# Patient Record
Sex: Female | Born: 1985 | Race: White | Hispanic: No | Marital: Single | Smoking: Current every day smoker
Health system: Southern US, Community
[De-identification: ages and names within clinical notes are randomized; demographics above are authoritative.]

## PROBLEM LIST (undated history)

## (undated) DIAGNOSIS — Z8739 Personal history of other diseases of the musculoskeletal system and connective tissue: Secondary | ICD-10-CM

## (undated) DIAGNOSIS — N83209 Unspecified ovarian cyst, unspecified side: Secondary | ICD-10-CM

## (undated) DIAGNOSIS — F909 Attention-deficit hyperactivity disorder, unspecified type: Secondary | ICD-10-CM

## (undated) DIAGNOSIS — F419 Anxiety disorder, unspecified: Secondary | ICD-10-CM

## (undated) DIAGNOSIS — E119 Type 2 diabetes mellitus without complications: Secondary | ICD-10-CM

## (undated) DIAGNOSIS — I10 Essential (primary) hypertension: Secondary | ICD-10-CM

## (undated) DIAGNOSIS — F410 Panic disorder [episodic paroxysmal anxiety] without agoraphobia: Secondary | ICD-10-CM

## (undated) DIAGNOSIS — N200 Calculus of kidney: Secondary | ICD-10-CM

## (undated) DIAGNOSIS — K259 Gastric ulcer, unspecified as acute or chronic, without hemorrhage or perforation: Secondary | ICD-10-CM

## (undated) HISTORY — PX: TONSILLECTOMY: SUR1361

## (undated) HISTORY — PX: TUBAL LIGATION: SHX77

---

## 2009-03-29 DIAGNOSIS — I10 Essential (primary) hypertension: Secondary | ICD-10-CM | POA: Insufficient documentation

## 2011-09-26 DIAGNOSIS — K219 Gastro-esophageal reflux disease without esophagitis: Secondary | ICD-10-CM | POA: Insufficient documentation

## 2011-10-02 DIAGNOSIS — Z765 Malingerer [conscious simulation]: Secondary | ICD-10-CM | POA: Insufficient documentation

## 2011-10-02 DIAGNOSIS — F1910 Other psychoactive substance abuse, uncomplicated: Secondary | ICD-10-CM | POA: Insufficient documentation

## 2014-01-20 DIAGNOSIS — G8929 Other chronic pain: Secondary | ICD-10-CM | POA: Insufficient documentation

## 2014-07-11 ENCOUNTER — Emergency Department: Payer: Self-pay | Admitting: Emergency Medicine

## 2014-08-05 DIAGNOSIS — M2240 Chondromalacia patellae, unspecified knee: Secondary | ICD-10-CM | POA: Insufficient documentation

## 2014-09-02 DIAGNOSIS — E1165 Type 2 diabetes mellitus with hyperglycemia: Secondary | ICD-10-CM | POA: Insufficient documentation

## 2014-09-02 DIAGNOSIS — IMO0002 Reserved for concepts with insufficient information to code with codable children: Secondary | ICD-10-CM | POA: Insufficient documentation

## 2014-09-02 DIAGNOSIS — F988 Other specified behavioral and emotional disorders with onset usually occurring in childhood and adolescence: Secondary | ICD-10-CM | POA: Insufficient documentation

## 2014-09-02 DIAGNOSIS — F319 Bipolar disorder, unspecified: Secondary | ICD-10-CM | POA: Insufficient documentation

## 2014-09-16 ENCOUNTER — Encounter (HOSPITAL_BASED_OUTPATIENT_CLINIC_OR_DEPARTMENT_OTHER): Payer: Self-pay | Admitting: *Deleted

## 2014-09-16 ENCOUNTER — Emergency Department (HOSPITAL_BASED_OUTPATIENT_CLINIC_OR_DEPARTMENT_OTHER)
Admission: EM | Admit: 2014-09-16 | Discharge: 2014-09-16 | Disposition: A | Discharge status: Home / Self Care | Payer: Medicaid Other | Attending: Emergency Medicine | Admitting: Emergency Medicine

## 2014-09-16 ENCOUNTER — Emergency Department (HOSPITAL_BASED_OUTPATIENT_CLINIC_OR_DEPARTMENT_OTHER): Payer: Medicaid Other

## 2014-09-16 DIAGNOSIS — Z79899 Other long term (current) drug therapy: Secondary | ICD-10-CM | POA: Diagnosis not present

## 2014-09-16 DIAGNOSIS — E119 Type 2 diabetes mellitus without complications: Secondary | ICD-10-CM | POA: Diagnosis not present

## 2014-09-16 DIAGNOSIS — I10 Essential (primary) hypertension: Secondary | ICD-10-CM | POA: Diagnosis not present

## 2014-09-16 DIAGNOSIS — Z8719 Personal history of other diseases of the digestive system: Secondary | ICD-10-CM | POA: Insufficient documentation

## 2014-09-16 DIAGNOSIS — Z880 Allergy status to penicillin: Secondary | ICD-10-CM | POA: Diagnosis not present

## 2014-09-16 DIAGNOSIS — Z720 Tobacco use: Secondary | ICD-10-CM | POA: Insufficient documentation

## 2014-09-16 DIAGNOSIS — Z87442 Personal history of urinary calculi: Secondary | ICD-10-CM | POA: Diagnosis not present

## 2014-09-16 DIAGNOSIS — F909 Attention-deficit hyperactivity disorder, unspecified type: Secondary | ICD-10-CM | POA: Diagnosis not present

## 2014-09-16 DIAGNOSIS — N390 Urinary tract infection, site not specified: Secondary | ICD-10-CM | POA: Insufficient documentation

## 2014-09-16 DIAGNOSIS — Z3202 Encounter for pregnancy test, result negative: Secondary | ICD-10-CM | POA: Diagnosis not present

## 2014-09-16 DIAGNOSIS — F410 Panic disorder [episodic paroxysmal anxiety] without agoraphobia: Secondary | ICD-10-CM | POA: Insufficient documentation

## 2014-09-16 DIAGNOSIS — R109 Unspecified abdominal pain: Secondary | ICD-10-CM | POA: Diagnosis present

## 2014-09-16 DIAGNOSIS — Z7982 Long term (current) use of aspirin: Secondary | ICD-10-CM | POA: Insufficient documentation

## 2014-09-16 DIAGNOSIS — R1011 Right upper quadrant pain: Secondary | ICD-10-CM

## 2014-09-16 HISTORY — DX: Calculus of kidney: N20.0

## 2014-09-16 HISTORY — DX: Anxiety disorder, unspecified: F41.9

## 2014-09-16 HISTORY — DX: Type 2 diabetes mellitus without complications: E11.9

## 2014-09-16 HISTORY — DX: Unspecified ovarian cyst, unspecified side: N83.209

## 2014-09-16 HISTORY — DX: Attention-deficit hyperactivity disorder, unspecified type: F90.9

## 2014-09-16 HISTORY — DX: Gastric ulcer, unspecified as acute or chronic, without hemorrhage or perforation: K25.9

## 2014-09-16 HISTORY — DX: Panic disorder (episodic paroxysmal anxiety): F41.0

## 2014-09-16 HISTORY — DX: Essential (primary) hypertension: I10

## 2014-09-16 LAB — CBC WITH DIFFERENTIAL/PLATELET
Basophils Absolute: 0 10*3/uL (ref 0.0–0.1)
Basophils Relative: 0 % (ref 0–1)
EOS ABS: 0.1 10*3/uL (ref 0.0–0.7)
Eosinophils Relative: 1 % (ref 0–5)
HCT: 38.7 % (ref 36.0–46.0)
HEMOGLOBIN: 13.5 g/dL (ref 12.0–15.0)
Lymphocytes Relative: 31 % (ref 12–46)
Lymphs Abs: 3.1 10*3/uL (ref 0.7–4.0)
MCH: 30.3 pg (ref 26.0–34.0)
MCHC: 34.9 g/dL (ref 30.0–36.0)
MCV: 86.8 fL (ref 78.0–100.0)
MONO ABS: 0.6 10*3/uL (ref 0.1–1.0)
Monocytes Relative: 6 % (ref 3–12)
NEUTROS PCT: 62 % (ref 43–77)
Neutro Abs: 6 10*3/uL (ref 1.7–7.7)
Platelets: 228 10*3/uL (ref 150–400)
RBC: 4.46 MIL/uL (ref 3.87–5.11)
RDW: 12.6 % (ref 11.5–15.5)
WBC: 9.8 10*3/uL (ref 4.0–10.5)

## 2014-09-16 LAB — COMPREHENSIVE METABOLIC PANEL
ALBUMIN: 3.8 g/dL (ref 3.5–5.2)
ALK PHOS: 89 U/L (ref 39–117)
ALT: 18 U/L (ref 0–35)
ANION GAP: 9 (ref 5–15)
AST: 14 U/L (ref 0–37)
BUN: 8 mg/dL (ref 6–23)
CHLORIDE: 104 mmol/L (ref 96–112)
CO2: 24 mmol/L (ref 19–32)
CREATININE: 0.61 mg/dL (ref 0.50–1.10)
Calcium: 8.8 mg/dL (ref 8.4–10.5)
GFR calc non Af Amer: >90 mL/min (ref >90)
Glucose, Bld: 105 mg/dL — ABNORMAL HIGH (ref 70–99)
Potassium: 3.5 mmol/L (ref 3.5–5.1)
Sodium: 137 mmol/L (ref 135–145)
TOTAL PROTEIN: 7 g/dL (ref 6.0–8.3)
Total Bilirubin: 0.6 mg/dL (ref 0.3–1.2)

## 2014-09-16 LAB — URINALYSIS, ROUTINE W REFLEX MICROSCOPIC
BILIRUBIN URINE: NEGATIVE
Glucose, UA: NEGATIVE mg/dL
KETONES UR: 15 mg/dL — AB
Nitrite: POSITIVE — AB
PROTEIN: 100 mg/dL — AB
Specific Gravity, Urine: 1.019 (ref 1.005–1.030)
Urobilinogen, UA: 1 mg/dL (ref 0.0–1.0)
pH: 5.5 (ref 5.0–8.0)

## 2014-09-16 LAB — URINE MICROSCOPIC-ADD ON

## 2014-09-16 LAB — PREGNANCY, URINE: Preg Test, Ur: NEGATIVE

## 2014-09-16 LAB — LIPASE, BLOOD: LIPASE: 22 U/L (ref 11–59)

## 2014-09-16 MED ORDER — MORPHINE SULFATE 4 MG/ML IJ SOLN
4.0000 mg | Freq: Once | INTRAMUSCULAR | Status: AC
  Administered 2014-09-16: 4 mg via INTRAVENOUS
  Filled 2014-09-16: qty 1

## 2014-09-16 MED ORDER — MORPHINE SULFATE 4 MG/ML IJ SOLN
4.0000 mg | Freq: Once | INTRAMUSCULAR | Status: AC
Start: 2014-09-16 — End: 2014-09-16
  Administered 2014-09-16: 4 mg via INTRAVENOUS
  Filled 2014-09-16: qty 1

## 2014-09-16 MED ORDER — HYDROCODONE-ACETAMINOPHEN 5-325 MG PO TABS: 1.0000 | ORAL_TABLET | Freq: Four times a day (QID) | ORAL | Status: DC | PRN

## 2014-09-16 MED ORDER — CEPHALEXIN 500 MG PO CAPS: 500.0000 mg | ORAL_CAPSULE | Freq: Two times a day (BID) | ORAL | Status: DC

## 2014-09-16 MED ORDER — PROMETHAZINE HCL 25 MG/ML IJ SOLN
12.5000 mg | Freq: Once | INTRAMUSCULAR | Status: AC
  Administered 2014-09-16: 12.5 mg via INTRAVENOUS
  Filled 2014-09-16: qty 1

## 2014-09-16 MED ORDER — CEPHALEXIN 500 MG PO CAPS: 500.0000 mg | ORAL_CAPSULE | Freq: Four times a day (QID) | ORAL | Status: DC

## 2014-09-16 NOTE — Discharge Instructions (Signed)
Take medications as prescribed. Follow up with PCP. Abdominal Pain Many things can cause abdominal pain. Usually, abdominal pain is not caused by a disease and will improve without treatment. It can often be observed and treated at home. Your health care provider will do a physical exam and possibly order blood tests and X-rays to help determine the seriousness of your pain. However, in many cases, more time must pass before a clear cause of the pain can be found. Before that point, your health care provider may not know if you need more testing or further treatment. HOME CARE INSTRUCTIONS  Monitor your abdominal pain for any changes. The following actions may help to alleviate any discomfort you are experiencing:  Only take over-the-counter or prescription medicines as directed by your health care provider.  Do not take laxatives unless directed to do so by your health care provider.  Try a clear liquid diet (broth, tea, or water) as directed by your health care provider. Slowly move to a bland diet as tolerated. SEEK MEDICAL CARE IF:  You have unexplained abdominal pain.  You have abdominal pain associated with nausea or diarrhea.  You have pain when you urinate or have a bowel movement.  You experience abdominal pain that wakes you in the night.  You have abdominal pain that is worsened or improved by eating food.  You have abdominal pain that is worsened with eating fatty foods.  You have a fever. SEEK IMMEDIATE MEDICAL CARE IF:   Your pain does not go away within 2 hours.  You keep throwing up (vomiting).  Your pain is felt only in portions of the abdomen, such as the right side or the left lower portion of the abdomen.  You pass bloody or black tarry stools. MAKE SURE YOU:  Understand these instructions.   Will watch your condition.   Will get help right away if you are not doing well or get worse.  Document Released: 02/20/2005 Document Revised: 05/18/2013  Document Reviewed: 01/20/2013 [facility] Patient Information 2015 [place], [place]. This information is not intended to replace advice given to you by your health care provider. Make sure you discuss any questions you have with your health care provider.  Urinary Tract Infection Urinary tract infections (UTIs) can develop anywhere along your urinary tract. Your urinary tract is your body's drainage system for removing wastes and extra water. Your urinary tract includes two kidneys, two ureters, a bladder, and a urethra. Your kidneys are a pair of bean-shaped organs. Each kidney is about the size of your fist. They are located below your ribs, one on each side of your spine. CAUSES Infections are caused by microbes, which are microscopic organisms, including fungi, viruses, and bacteria. These organisms are so small that they can only be seen through a microscope. Bacteria are the microbes that most commonly cause UTIs. SYMPTOMS  Symptoms of UTIs may vary by age and gender of the patient and by the location of the infection. Symptoms in young women typically include a frequent and intense urge to urinate and a painful, burning feeling in the bladder or urethra during urination. Older women and men are more likely to be tired, shaky, and weak and have muscle aches and abdominal pain. A fever may mean the infection is in your kidneys. Other symptoms of a kidney infection include pain in your back or sides below the ribs, nausea, and vomiting. DIAGNOSIS To diagnose a UTI, your caregiver will ask you about your symptoms. Your caregiver also will  ask to provide a urine sample. The urine sample will be tested for bacteria and white blood cells. White blood cells are made by your body to help fight infection. TREATMENT  Typically, UTIs can be treated with medication. Because most UTIs are caused by a bacterial infection, they usually can be treated with the use of antibiotics. The choice of antibiotic and length of  treatment depend on your symptoms and the type of bacteria causing your infection. HOME CARE INSTRUCTIONS  If you were prescribed antibiotics, take them exactly as your caregiver instructs you. Finish the medication even if you feel better after you have only taken some of the medication.  Drink enough water and fluids to keep your urine clear or pale yellow.  Avoid caffeine, tea, and carbonated beverages. They tend to irritate your bladder.  Empty your bladder often. Avoid holding urine for long periods of time.  Empty your bladder before and after sexual intercourse.  After a bowel movement, women should cleanse from front to back. Use each tissue only once. SEEK MEDICAL CARE IF:   You have back pain.  You develop a fever.  Your symptoms do not begin to resolve within 3 days. SEEK IMMEDIATE MEDICAL CARE IF:   You have severe back pain or lower abdominal pain.  You develop chills.  You have nausea or vomiting.  You have continued burning or discomfort with urination. MAKE SURE YOU:   Understand these instructions.  Will watch your condition.  Will get help right away if you are not doing well or get worse. Document Released: 02/20/2005 Document Revised: 11/12/2011 Document Reviewed: 06/21/2011 [facility] Patient Information 2015 [place]. This information is not intended to replace advice given to you by your health care provider. Make sure you discuss any questions you have with your health care provider.

## 2014-09-16 NOTE — ED Provider Notes (Signed)
CSN: [id number]     Arrival date & time 09/16/14  1143 History   First MD Initiated Contact with Patient 09/16/14 1238     Chief Complaint  Patient presents with  . Abdominal Pain     (Consider location/radiation/quality/duration/timing/severity/associated sxs/prior Treatment) HPI Comments: 29 yo female with a history of DM, HTN, gastric ulcer, kidney stones and GERD presenting with abdominal pain associated with nausea that started yesterday when she woke up.  Pain is described as severe 10/10 and unrelieved after taking Tums and Tylenol.  Pain is worse with movement, palpation and deep breaths.  Not associated with food.  Denies previous pain like this. Pain radiates to right flank, however states this does not feel like previous kidney stone.  Denies dysuria, frequency, vaginal discharge, fever, CP or SOB.   Patient is a 29 y.o. female presenting with abdominal pain.  Abdominal Pain   Past Medical History  Diagnosis Date  . Hypertension   . Diabetes mellitus without complication   . Anxiety   . Panic attack   . ADHD (attention deficit hyperactivity disorder)   . Stomach ulcer   . Ovarian cyst   . Kidney stone    Past Surgical History  Procedure Laterality Date  . Cesarean section    . Tonsillectomy    . Tubal ligation     No family history on file. History  Substance Use Topics  . Smoking status: Current Every Day Smoker -- 0.50 packs/day    Types: Cigarettes  . Smokeless tobacco: Not on file  . Alcohol Use: No   OB History    No data available     Review of Systems  Gastrointestinal: Positive for abdominal pain.  All other systems reviewed and are negative.     Allergies  Nsaids; Penicillins; and Zofran  Home Medications   Prior to Admission medications   Medication Sig Start Date End Date Taking? Authorizing Provider  ALPRAZolam (XANAX PO) Take by mouth.   Yes Historical Provider, MD  AmLODIPine Besylate (NORVASC PO) Take by mouth.   Yes Historical  Provider, MD  Aspirin (ASPIR-81 PO) Take by mouth.   Yes Historical Provider, MD  buprenorphine (SUBUTEX) 2 MG SUBL SL tablet Place 4 mg under the tongue daily.   Yes Historical Provider, MD  Lisdexamfetamine Dimesylate (VYVANSE PO) Take by mouth.   Yes Historical Provider, MD  LISINOPRIL PO Take by mouth.   Yes Historical Provider, MD  METFORMIN HCL PO Take by mouth.   Yes Historical Provider, MD   BP 111/79 mmHg  Pulse 92  Temp(Src) 98 F (36.7 C) (Oral)  Resp 20  Ht 5\' 5"  (1.651 m)  Wt 212 lb (96.163 kg)  BMI 35.28 kg/m2  SpO2 98%  LMP 09/02/2014 Physical Exam  Constitutional: She appears well-developed and well-nourished. No distress.  HENT:  Head: Normocephalic and atraumatic.  Eyes: EOM are normal.  Neck: Normal range of motion.  Cardiovascular: Normal rate, regular rhythm and normal heart sounds.   No murmur heard. Pulmonary/Chest: Effort normal and breath sounds normal. She has no wheezes. She has no rales.  Abdominal: Soft. She exhibits no shifting dullness, no distension and no mass. There is no hepatosplenomegaly. There is tenderness in the right upper quadrant. There is guarding. There is no rebound.  Lymphadenopathy:    She has no cervical adenopathy.  Nursing note and vitals reviewed.   ED Course  Procedures (including critical care time) Labs Review Labs Reviewed  URINALYSIS, ROUTINE W REFLEX MICROSCOPIC - Abnormal;  Notable for the following:    Color, Urine AMBER (*)    APPearance TURBID (*)    Hgb urine dipstick LARGE (*)    Ketones, ur 15 (*)    Protein, ur 100 (*)    Nitrite POSITIVE (*)    Leukocytes, UA LARGE (*)    All other components within normal limits  URINE MICROSCOPIC-ADD ON - Abnormal; Notable for the following:    Bacteria, UA MANY (*)    All other components within normal limits  COMPREHENSIVE METABOLIC PANEL - Abnormal; Notable for the following:    Glucose, Bld 105 (*)    All other components within normal limits  URINE CULTURE   PREGNANCY, URINE  CBC WITH DIFFERENTIAL/PLATELET  LIPASE, BLOOD    Imaging Review US Abdomen Complete  09/16/2014   CLINICAL DATA:  Right upper quadrant pain for 1 day  EXAM: ULTRASOUND ABDOMEN COMPLETE  COMPARISON:  None.  FINDINGS: Gallbladder: No gallstones or wall thickening visualized. No sonographic Murphy sign noted.  Common bile duct: Diameter: 3 mm  Liver: Mildly heterogeneous. No focal mass lesion is seen no biliary ductal dilatation is noted. This is of uncertain significance correlation with liver function tests is recommended.  IVC: Not well seen due to overlying bowel gas.  Pancreas: Not well visualized due to overlying bowel gas.  Spleen: Size and appearance within normal limits.  Right Kidney: Length: 10.5 cm. Echogenicity within normal limits. No mass or hydronephrosis visualized.  Left Kidney: Length: 11.6 cm. Minimal fullness of the collecting system is noted but may be related to a distended bladder.  Abdominal aorta: No aneurysm visualized.  Other findings: None.  IMPRESSION: Heterogeneity of the liver without focal mass lesion. This may be related to some underlying hepatic inflammatory change. Correlation with liver function tests is recommended.  Mild fullness of the left renal collecting system which may be related to a distended bladder.   Electronically Signed   By: Alcide Clever M.D.   On: 09/16/2014 14:54     EKG Interpretation None      MDM   Final diagnoses:  RUQ abdominal pain  UTI (lower urinary tract infection)   1:37 PM Labs unremarkable.  GB ultrasound negative.  UA positive for significant leuks and nitrites; will treat with Kelfex and order Urine culture. Pt reports she has allergy to PCN (hives), however has taken Keflex in the past without problems. Initial improvement with IV Morphine then recurrent pain- will give one additional dose of Morphine IV prior to dc. Given hydrocodone for pain. Given return precautions.     [name], NP 09/16/14  1533  Vanetta Mulders, MD 09/19/14 [phone]

## 2014-09-16 NOTE — ED Notes (Signed)
MD at bedside. 

## 2014-09-16 NOTE — ED Notes (Signed)
Pt states she had "a severe reflux attack" yesterday, when this pain started along with epigastric burning, took tums and burning resolved, but this pain continues.

## 2014-09-16 NOTE — ED Notes (Signed)
Right mid abdominal quad pain since yesterday.

## 2014-09-19 ENCOUNTER — Telehealth (HOSPITAL_BASED_OUTPATIENT_CLINIC_OR_DEPARTMENT_OTHER): Payer: Self-pay | Admitting: Emergency Medicine

## 2014-09-19 LAB — URINE CULTURE: SPECIAL REQUESTS: NORMAL

## 2014-09-20 ENCOUNTER — Telehealth (HOSPITAL_COMMUNITY): Payer: Self-pay | Admitting: *Deleted

## 2014-09-20 NOTE — Progress Notes (Signed)
ED Antimicrobial Stewardship Positive Culture Follow Up   Haley Howell is an 29 y.o. female [facility] with a chief complaint of abdominal pain. Chief Complaint  Patient presents with  . Abdominal Pain    Recent Results (from the past 720 hour(s))  Urine culture     Status: None   Collection Time: 09/16/14 12:10 PM  Result Value Ref Range Status   Specimen Description URINE, CLEAN CATCH  Final   Special Requests Normal  Final   Colony Count   Final    >=100,000 COLONIES/ML Performed at [facility]    Culture   Final    ESCHERICHIA COLI Note: Confirmed Extended Spectrum Beta-Lactamase Producer (ESBL) CRITICAL RESULT CALLED TO, READ BACK BY AND VERIFIED WITH: L.MILLER 3:38PM 09/19/14 HAJAM Performed at [facility]    Report Status 09/19/2014 FINAL  Final   Organism ID, Bacteria ESCHERICHIA COLI  Final      Susceptibility   Escherichia coli - MIC*    AMPICILLIN >=32 RESISTANT Resistant     CEFAZOLIN >=64 RESISTANT Resistant     CEFTRIAXONE >=64 RESISTANT Resistant     CIPROFLOXACIN >=4 RESISTANT Resistant     GENTAMICIN <=1 SENSITIVE Sensitive     LEVOFLOXACIN >=8 RESISTANT Resistant     NITROFURANTOIN <=16 SENSITIVE Sensitive     TOBRAMYCIN 8 INTERMEDIATE Intermediate     TRIMETH/SULFA <=20 SENSITIVE Sensitive     IMIPENEM <=0.25 SENSITIVE Sensitive     PIP/TAZO 8 SENSITIVE Sensitive     * ESCHERICHIA COLI    [x]  Treated with cephalxein 500 mg, organism resistant to prescribed antimicrobial  29 y/o F with abdominal pain and nausea.  No dysuria.  UA nitrite positive and large leukocytes.  Treated with cephalexin but organism is resistant.  New antibiotic prescription: Bactrim 800mg /160mg  Take 1 tablet twice daily for 3 days.  Instruct patient to stop taking cephalexin.  ED Provider: Emilia BeckKaitlyn Szekalski, PA-C  Sandi CarneNick Gazda, PharmD Candidate  Russ HaloAshley Taiz Bickle, PharmD Clinical Pharmacist - Resident Pager:  [phone] 4/26/20169:05 AM

## 2014-09-24 ENCOUNTER — Encounter (HOSPITAL_BASED_OUTPATIENT_CLINIC_OR_DEPARTMENT_OTHER): Payer: Self-pay | Admitting: *Deleted

## 2014-09-24 ENCOUNTER — Emergency Department (HOSPITAL_BASED_OUTPATIENT_CLINIC_OR_DEPARTMENT_OTHER): Payer: Medicaid Other

## 2014-09-24 ENCOUNTER — Emergency Department (HOSPITAL_BASED_OUTPATIENT_CLINIC_OR_DEPARTMENT_OTHER)
Admission: EM | Admit: 2014-09-24 | Discharge: 2014-09-24 | Disposition: A | Discharge status: Home / Self Care | Payer: Medicaid Other | Attending: Emergency Medicine | Admitting: Emergency Medicine

## 2014-09-24 DIAGNOSIS — F410 Panic disorder [episodic paroxysmal anxiety] without agoraphobia: Secondary | ICD-10-CM | POA: Insufficient documentation

## 2014-09-24 DIAGNOSIS — Z792 Long term (current) use of antibiotics: Secondary | ICD-10-CM | POA: Diagnosis not present

## 2014-09-24 DIAGNOSIS — Z720 Tobacco use: Secondary | ICD-10-CM | POA: Insufficient documentation

## 2014-09-24 DIAGNOSIS — I10 Essential (primary) hypertension: Secondary | ICD-10-CM | POA: Insufficient documentation

## 2014-09-24 DIAGNOSIS — R109 Unspecified abdominal pain: Secondary | ICD-10-CM | POA: Diagnosis present

## 2014-09-24 DIAGNOSIS — Z3202 Encounter for pregnancy test, result negative: Secondary | ICD-10-CM | POA: Diagnosis not present

## 2014-09-24 DIAGNOSIS — Z8742 Personal history of other diseases of the female genital tract: Secondary | ICD-10-CM | POA: Insufficient documentation

## 2014-09-24 DIAGNOSIS — Z8719 Personal history of other diseases of the digestive system: Secondary | ICD-10-CM | POA: Diagnosis not present

## 2014-09-24 DIAGNOSIS — F909 Attention-deficit hyperactivity disorder, unspecified type: Secondary | ICD-10-CM | POA: Insufficient documentation

## 2014-09-24 DIAGNOSIS — Z87442 Personal history of urinary calculi: Secondary | ICD-10-CM | POA: Diagnosis not present

## 2014-09-24 DIAGNOSIS — N390 Urinary tract infection, site not specified: Secondary | ICD-10-CM

## 2014-09-24 DIAGNOSIS — E119 Type 2 diabetes mellitus without complications: Secondary | ICD-10-CM | POA: Insufficient documentation

## 2014-09-24 DIAGNOSIS — Z880 Allergy status to penicillin: Secondary | ICD-10-CM | POA: Insufficient documentation

## 2014-09-24 DIAGNOSIS — Z9851 Tubal ligation status: Secondary | ICD-10-CM | POA: Diagnosis not present

## 2014-09-24 DIAGNOSIS — Z79899 Other long term (current) drug therapy: Secondary | ICD-10-CM | POA: Insufficient documentation

## 2014-09-24 DIAGNOSIS — Z7982 Long term (current) use of aspirin: Secondary | ICD-10-CM | POA: Insufficient documentation

## 2014-09-24 LAB — URINALYSIS, ROUTINE W REFLEX MICROSCOPIC
Bilirubin Urine: NEGATIVE
GLUCOSE, UA: NEGATIVE mg/dL
KETONES UR: NEGATIVE mg/dL
Nitrite: NEGATIVE
PROTEIN: 30 mg/dL — AB
Specific Gravity, Urine: 1.017 (ref 1.005–1.030)
UROBILINOGEN UA: 0.2 mg/dL (ref 0.0–1.0)
pH: 6 (ref 5.0–8.0)

## 2014-09-24 LAB — CBC WITH DIFFERENTIAL/PLATELET
BASOS ABS: 0 10*3/uL (ref 0.0–0.1)
Basophils Relative: 0 % (ref 0–1)
EOS ABS: 0 10*3/uL (ref 0.0–0.7)
EOS PCT: 0 % (ref 0–5)
HCT: 41 % (ref 36.0–46.0)
Hemoglobin: 14.1 g/dL (ref 12.0–15.0)
LYMPHS ABS: 2.6 10*3/uL (ref 0.7–4.0)
LYMPHS PCT: 24 % (ref 12–46)
MCH: 29.7 pg (ref 26.0–34.0)
MCHC: 34.4 g/dL (ref 30.0–36.0)
MCV: 86.3 fL (ref 78.0–100.0)
Monocytes Absolute: 0.6 10*3/uL (ref 0.1–1.0)
Monocytes Relative: 6 % (ref 3–12)
NEUTROS PCT: 70 % (ref 43–77)
Neutro Abs: 7.3 10*3/uL (ref 1.7–7.7)
Platelets: 277 10*3/uL (ref 150–400)
RBC: 4.75 MIL/uL (ref 3.87–5.11)
RDW: 12.5 % (ref 11.5–15.5)
WBC: 10.6 10*3/uL — AB (ref 4.0–10.5)

## 2014-09-24 LAB — BASIC METABOLIC PANEL
Anion gap: 6 (ref 5–15)
BUN: 13 mg/dL (ref 6–23)
CO2: 24 mmol/L (ref 19–32)
CREATININE: 0.67 mg/dL (ref 0.50–1.10)
Calcium: 9.5 mg/dL (ref 8.4–10.5)
Chloride: 108 mmol/L (ref 96–112)
GFR calc Af Amer: >90 mL/min (ref >90)
GFR calc non Af Amer: >90 mL/min (ref >90)
Glucose, Bld: 98 mg/dL (ref 70–99)
Potassium: 3.5 mmol/L (ref 3.5–5.1)
Sodium: 138 mmol/L (ref 135–145)

## 2014-09-24 LAB — URINE MICROSCOPIC-ADD ON

## 2014-09-24 LAB — PREGNANCY, URINE: PREG TEST UR: NEGATIVE

## 2014-09-24 MED ORDER — HYDROMORPHONE HCL 1 MG/ML IJ SOLN
1.0000 mg | Freq: Once | INTRAMUSCULAR | Status: AC
  Administered 2014-09-24: 1 mg via INTRAVENOUS
  Filled 2014-09-24: qty 1

## 2014-09-24 MED ORDER — PROMETHAZINE HCL 25 MG/ML IJ SOLN
25.0000 mg | Freq: Once | INTRAMUSCULAR | Status: AC
  Administered 2014-09-24: 25 mg via INTRAVENOUS
  Filled 2014-09-24: qty 1

## 2014-09-24 NOTE — Discharge Instructions (Signed)

## 2014-09-24 NOTE — ED Provider Notes (Signed)
CSN: [id number]     Arrival date & time 09/24/14  [id number] History   First MD Initiated Contact with Patient 09/24/14 0825     Chief Complaint  Patient presents with  . Abdominal Pain     (Consider location/radiation/quality/duration/timing/severity/associated sxs/prior Treatment) HPI Comments: Patient presents to the emergency department for evaluation of right flank pain. Patient reports that symptoms have been ongoing for one week. She was seen in the ER previously and diagnosed with urinary tract infection. Patient reports that she was called at home because the antibiotic needed to be changed, now taking Bactrim. Patient reports continuous right flank and lower groin area pain that is continuous, sharp, stabbing and severe. No associated nausea and vomiting. She does report fevers at home to 102.  Patient is a 29 y.o. female presenting with abdominal pain.  Abdominal Pain Associated symptoms: fever     Past Medical History  Diagnosis Date  . Hypertension   . Diabetes mellitus without complication   . Anxiety   . Panic attack   . ADHD (attention deficit hyperactivity disorder)   . Stomach ulcer   . Ovarian cyst   . Kidney stone    Past Surgical History  Procedure Laterality Date  . Cesarean section    . Tonsillectomy    . Tubal ligation     History reviewed. No pertinent family history. History  Substance Use Topics  . Smoking status: Current Every Day Smoker -- 0.50 packs/day    Types: Cigarettes  . Smokeless tobacco: Not on file  . Alcohol Use: No   OB History    No data available     Review of Systems  Constitutional: Positive for fever.  Gastrointestinal: Positive for abdominal pain.  Genitourinary: Positive for flank pain.  All other systems reviewed and are negative.     Allergies  Nsaids; Penicillins; and Zofran  Home Medications   Prior to Admission medications   Medication Sig Start Date End Date Taking? Authorizing Provider  ALPRAZolam (XANAX  PO) Take by mouth.    Historical Provider, MD  AmLODIPine Besylate (NORVASC PO) Take by mouth.    Historical Provider, MD  Aspirin (ASPIR-81 PO) Take by mouth.    Historical Provider, MD  buprenorphine (SUBUTEX) 2 MG SUBL SL tablet Place 4 mg under the tongue daily.    Historical Provider, MD  cephALEXin (KEFLEX) 500 MG capsule Take 1 capsule (500 mg total) by mouth 2 (two) times daily. 09/16/14   Teressa LowerVrinda Pickering, NP  HYDROcodone-acetaminophen (NORCO/VICODIN) 5-325 MG per tablet Take 1-2 tablets by mouth every 6 (six) hours as needed. 09/16/14   Teressa LowerVrinda Pickering, NP  Lisdexamfetamine Dimesylate (VYVANSE PO) Take by mouth.    Historical Provider, MD  LISINOPRIL PO Take by mouth.    Historical Provider, MD  METFORMIN HCL PO Take by mouth.    Historical Provider, MD   BP 127/87 mmHg  Pulse 100  Temp(Src) 98.1 F (36.7 C) (Oral)  Resp 16  SpO2 98%  LMP 09/02/2014 Physical Exam  Constitutional: She is oriented to person, place, and time. She appears well-developed and well-nourished. No distress.  HENT:  Head: Normocephalic and atraumatic.  Right Ear: Hearing normal.  Left Ear: Hearing normal.  Nose: Nose normal.  Mouth/Throat: Oropharynx is clear and moist and mucous membranes are normal.  Eyes: Conjunctivae and EOM are normal. Pupils are equal, round, and reactive to light.  Neck: Normal range of motion. Neck supple.  Cardiovascular: Regular rhythm, S1 normal and S2 normal.  Exam reveals  no gallop and no friction rub.   No murmur heard. Pulmonary/Chest: Effort normal and breath sounds normal. No respiratory distress. She exhibits no tenderness.  Abdominal: Soft. Normal appearance and bowel sounds are normal. There is no hepatosplenomegaly. There is tenderness in the right lower quadrant. There is CVA tenderness. There is no rebound, no guarding, no tenderness at McBurney's point and negative Murphy's sign. No hernia.  Musculoskeletal: Normal range of motion.  Neurological: She is alert  and oriented to person, place, and time. She has normal strength. No cranial nerve deficit or sensory deficit. Coordination normal. GCS eye subscore is 4. GCS verbal subscore is 5. GCS motor subscore is 6.  Skin: Skin is warm, dry and intact. No rash noted. No cyanosis.  Psychiatric: She has a normal mood and affect. Her speech is normal and behavior is normal. Thought content normal.  Nursing note and vitals reviewed.   ED Course  Procedures (including critical care time) Labs Review Labs Reviewed  URINALYSIS, ROUTINE W REFLEX MICROSCOPIC - Abnormal; Notable for the following:    APPearance CLOUDY (*)    Hgb urine dipstick LARGE (*)    Protein, ur 30 (*)    Leukocytes, UA SMALL (*)    All other components within normal limits  CBC WITH DIFFERENTIAL/PLATELET - Abnormal; Notable for the following:    WBC 10.6 (*)    All other components within normal limits  URINE MICROSCOPIC-ADD ON - Abnormal; Notable for the following:    Squamous Epithelial / LPF MANY (*)    Bacteria, UA MANY (*)    All other components within normal limits  PREGNANCY, URINE  BASIC METABOLIC PANEL    Imaging Review Ct Renal Stone Study  09/24/2014   CLINICAL DATA:  One week history of right flank pain  EXAM: CT ABDOMEN AND PELVIS WITHOUT CONTRAST  TECHNIQUE: Multidetector CT imaging of the abdomen and pelvis was performed following the standard protocol without oral or intravenous contrast material administration.  COMPARISON:  Abdominal ultrasound September 16, 2014  FINDINGS: There is mild bibasilar lung atelectatic change.  No focal liver lesions are identified on this noncontrast enhanced study. The gallbladder wall is not thickened. There is no biliary duct dilatation.  Spleen, pancreas, and adrenals appear normal. A small splenule is noted inferior and posterior to the spleen.  Kidneys bilaterally show no mass or hydronephrosis on either side. There is no renal or ureteral calculus on either side.  In the pelvis, the  urinary bladder is midline with normal wall thickness. There is no pelvic mass or pelvic fluid collection. Appendix appears normal.  There is no bowel obstruction. No free air or portal venous air. There is no appreciable ascites, adenopathy, or abscess in the abdomen or pelvis. Aorta appears unremarkable. There are no blastic or lytic bone lesions.  IMPRESSION: No renal or ureteral calculus. No hydronephrosis. No perinephric fluid or evidence suggesting renal inflammation on this noncontrast enhanced study.  Appendix appears normal.  No abscess.  No bowel obstruction.   Electronically Signed   By: Bretta Bang III M.D.   On: 09/24/2014 08:46     EKG Interpretation None      MDM   Final diagnoses:  UTI (lower urinary tract infection)    Patient presents to the emergency department for continued right-sided pain. Patient was seen several days ago and diagnosed with urinary tract infection. Her culture was positive and the Escherichia coli was resistant to be prescribed Keflex. Her prescription was changed to Bactrim. She  reports that she is continuing to have pain in the back and right flank. She also reports fever. She does not have a fever here in the ER.  Patient denied having any other visits but it was discovered that she was seen in the colon system several days ago. She also had a CAT scan at that point, but this was not discovered until after she had her CAT scan here. When confronted about this she reported "I did not think it mattered". Patient does have multiple venipuncture sites on her arms, possibly from other visits, cannot rule out IV drug use. I am suspicious that the patient is drug seeking at this point, will not be given any further prescriptions.  Upon being discharged, patient reports that she will likely be going back to [place] because they gave her pain medication.    Gilda Crease, MD 09/24/14 1259

## 2014-09-24 NOTE — ED Notes (Signed)
Pt c/o and pain and right flank pain x 1 week, see here Dx UTI taking ABX and is out of pain med

## 2014-09-24 NOTE — ED Notes (Signed)
MD at bedside. 

## 2014-12-06 ENCOUNTER — Emergency Department (INDEPENDENT_AMBULATORY_CARE_PROVIDER_SITE_OTHER): Payer: Medicaid Other

## 2014-12-06 ENCOUNTER — Emergency Department (INDEPENDENT_AMBULATORY_CARE_PROVIDER_SITE_OTHER)
Admission: EM | Admit: 2014-12-06 | Discharge: 2014-12-06 | Disposition: A | Discharge status: Home / Self Care | Payer: Medicaid Other | Source: Home / Self Care | Attending: Family Medicine | Admitting: Family Medicine

## 2014-12-06 ENCOUNTER — Encounter: Payer: Self-pay | Admitting: Emergency Medicine

## 2014-12-06 DIAGNOSIS — W010XXA Fall on same level from slipping, tripping and stumbling without subsequent striking against object, initial encounter: Secondary | ICD-10-CM

## 2014-12-06 DIAGNOSIS — M25561 Pain in right knee: Secondary | ICD-10-CM

## 2014-12-06 HISTORY — DX: Personal history of other diseases of the musculoskeletal system and connective tissue: Z87.39

## 2014-12-06 MED ORDER — TRAMADOL HCL 50 MG PO TABS: 50.0000 mg | ORAL_TABLET | Freq: Four times a day (QID) | ORAL | Status: DC | PRN

## 2014-12-06 NOTE — ED Notes (Signed)
Right knee injury from fall today

## 2014-12-06 NOTE — ED Provider Notes (Signed)
CSN: [id number]     Arrival date & time 12/06/14  1108 History   First MD Initiated Contact with Patient 12/06/14 1123     Chief Complaint  Patient presents with  . Fall   (Consider location/radiation/quality/duration/timing/severity/associated sxs/prior Treatment) HPI  Patient is a 29 year old female presenting to [facility] with c/o sudden onset Right knee pain that started after she tripped and fell walking up a hill. Pt states she feels like she landed on a tree root.  Pain is constant, aching, sore and throbbing, severe. Worse with weight bearing, movement and palpation. Incident occurred just PTA. No medications tried PTA. Denies any other injuries during the fall.  Denies prior hx of Right knee injuries or surgeries.    Past Medical History  Diagnosis Date  . Hypertension   . Diabetes mellitus without complication   . Anxiety   . Panic attack   . ADHD (attention deficit hyperactivity disorder)   . Stomach ulcer   . Ovarian cyst   . Kidney stone   . H/O degenerative disc disease    Past Surgical History  Procedure Laterality Date  . Cesarean section    . Tonsillectomy    . Tubal ligation     No family history on file. History  Substance Use Topics  . Smoking status: Current Every Day Smoker -- 1.00 packs/day    Types: Cigarettes  . Smokeless tobacco: Not on file  . Alcohol Use: No   OB History    No data available     Review of Systems  Musculoskeletal: Positive for myalgias, joint swelling, arthralgias and gait problem. Negative for back pain, neck pain and neck stiffness.  Skin: Negative for color change and wound.  Neurological: Negative for weakness and numbness.    Allergies  Nsaids; Penicillins; and Zofran  Home Medications   Prior to Admission medications   Medication Sig Start Date End Date Taking? Authorizing Provider  ALPRAZolam (XANAX PO) Take by mouth.    Historical Provider, MD  AmLODIPine Besylate (NORVASC PO) Take by mouth.    Historical Provider,  MD  Aspirin (ASPIR-81 PO) Take by mouth.    Historical Provider, MD  buprenorphine (SUBUTEX) 2 MG SUBL SL tablet Place 4 mg under the tongue daily.    Historical Provider, MD  cephALEXin (KEFLEX) 500 MG capsule Take 1 capsule (500 mg total) by mouth 2 (two) times daily. 09/16/14   Teressa LowerVrinda Pickering, NP  HYDROcodone-acetaminophen (NORCO/VICODIN) 5-325 MG per tablet Take 1-2 tablets by mouth every 6 (six) hours as needed. 09/16/14   Teressa LowerVrinda Pickering, NP  Lisdexamfetamine Dimesylate (VYVANSE PO) Take by mouth.    Historical Provider, MD  LISINOPRIL PO Take by mouth.    Historical Provider, MD  METFORMIN HCL PO Take by mouth.    Historical Provider, MD  traMADol (ULTRAM) 50 MG tablet Take 1 tablet (50 mg total) by mouth every 6 (six) hours as needed. 12/06/14   Junius FinnerErin O'Malley, PA-C   BP 126/85 mmHg  Pulse 90  Temp(Src) 98.3 F (36.8 C) (Oral)  Ht 5\' 5"  (1.651 m)  Wt 205 lb (92.987 kg)  BMI 34.11 kg/m2  SpO2 96% Physical Exam  Constitutional: She is oriented to person, place, and time. She appears well-developed and well-nourished.  HENT:  Head: Normocephalic and atraumatic.  Eyes: EOM are normal.  Neck: Normal range of motion.  Cardiovascular: Normal rate.   Pulses:      Dorsalis pedis pulses are 2+ on the right side.  Pulmonary/Chest: Effort normal.  Musculoskeletal:  She exhibits tenderness. She exhibits no edema.  Right knee: no obvious deformity of edema. Tenderness to anterior aspect of knee. No crepitus. Limited knee Flexion due to pain.  Calf is soft, non-tender. 5/5 plantarflexion and dorsiflexion of Right foot.  Neurological: She is alert and oriented to person, place, and time.  Skin: Skin is warm and dry.  Right knee: Skin in tact. No ecchymosis, erythema, or warmth. No red streaking, induration, or evidence of underlying infection.  Psychiatric: She has a normal mood and affect. Her behavior is normal.  Nursing note and vitals reviewed.   ED Course  Procedures (including  critical care time) Labs Review Labs Reviewed - No data to display  Imaging Review Dg Knee Complete 4 Views Right  12/06/2014   CLINICAL DATA:  Larey Seat today.  Right knee pain.  EXAM: RIGHT KNEE - COMPLETE 4+ VIEW  COMPARISON:  None.  FINDINGS: The joint spaces are maintained. No acute fracture or osteochondral abnormality. No joint effusion.  IMPRESSION: No acute bony findings.   Electronically Signed   By: Rudie Meyer M.D.   On: 12/06/2014 11:58     MDM   1. Right knee pain   2. Fall from slip, trip, or stumble, initial encounter    Patient is a 29 year old female presenting to urgent care with sudden onset right knee pain after trip and fall.  No other injuries.  Right leg is neurovascularly intact.  Anterior knee tenderness.  Compartments are soft.  No evidence of compartment syndrome. Plain films are negative for acute bony injury or joint effusion. Due to pain worsening with knee flexion will provide a strep as well as crutches for comfort.  Discussed RICE therapy.  Advised patient to follow-up with orthopedist in 1-2 weeks if symptoms not improving as she may need additional imaging or referral to PT. Patient states she is unable to take NSAIDs due to a stomach ulcer. Patient has buprenorphine listed on her medication list. [facility] Controlled substance database also concerning for small quantities of Vicodin prescribed monthly for last few months by various providers.  Hesitant to prescribe anything stronger than tramadol at this time. Pt left facility without incident.    [name], PA-C 12/06/14 1258

## 2015-02-07 ENCOUNTER — Emergency Department (HOSPITAL_COMMUNITY): Payer: Medicaid Other

## 2015-02-07 ENCOUNTER — Encounter (HOSPITAL_COMMUNITY): Payer: Self-pay | Admitting: Emergency Medicine

## 2015-02-07 ENCOUNTER — Emergency Department (HOSPITAL_COMMUNITY)
Admission: EM | Admit: 2015-02-07 | Discharge: 2015-02-07 | Disposition: A | Discharge status: Home / Self Care | Payer: Medicaid Other | Attending: Emergency Medicine | Admitting: Emergency Medicine

## 2015-02-07 DIAGNOSIS — F410 Panic disorder [episodic paroxysmal anxiety] without agoraphobia: Secondary | ICD-10-CM | POA: Diagnosis not present

## 2015-02-07 DIAGNOSIS — Z880 Allergy status to penicillin: Secondary | ICD-10-CM | POA: Diagnosis not present

## 2015-02-07 DIAGNOSIS — Z8719 Personal history of other diseases of the digestive system: Secondary | ICD-10-CM | POA: Insufficient documentation

## 2015-02-07 DIAGNOSIS — Z79899 Other long term (current) drug therapy: Secondary | ICD-10-CM | POA: Insufficient documentation

## 2015-02-07 DIAGNOSIS — Z8739 Personal history of other diseases of the musculoskeletal system and connective tissue: Secondary | ICD-10-CM | POA: Insufficient documentation

## 2015-02-07 DIAGNOSIS — I10 Essential (primary) hypertension: Secondary | ICD-10-CM | POA: Diagnosis not present

## 2015-02-07 DIAGNOSIS — Z3202 Encounter for pregnancy test, result negative: Secondary | ICD-10-CM | POA: Insufficient documentation

## 2015-02-07 DIAGNOSIS — N390 Urinary tract infection, site not specified: Secondary | ICD-10-CM | POA: Insufficient documentation

## 2015-02-07 DIAGNOSIS — Z7982 Long term (current) use of aspirin: Secondary | ICD-10-CM | POA: Diagnosis not present

## 2015-02-07 DIAGNOSIS — Z9851 Tubal ligation status: Secondary | ICD-10-CM | POA: Insufficient documentation

## 2015-02-07 DIAGNOSIS — Z87442 Personal history of urinary calculi: Secondary | ICD-10-CM | POA: Diagnosis not present

## 2015-02-07 DIAGNOSIS — R109 Unspecified abdominal pain: Secondary | ICD-10-CM | POA: Diagnosis present

## 2015-02-07 DIAGNOSIS — Z720 Tobacco use: Secondary | ICD-10-CM | POA: Insufficient documentation

## 2015-02-07 DIAGNOSIS — E119 Type 2 diabetes mellitus without complications: Secondary | ICD-10-CM | POA: Diagnosis not present

## 2015-02-07 LAB — CBC WITH DIFFERENTIAL/PLATELET
BASOS ABS: 0 10*3/uL (ref 0.0–0.1)
Basophils Relative: 0 % (ref 0–1)
EOS PCT: 2 % (ref 0–5)
Eosinophils Absolute: 0.2 10*3/uL (ref 0.0–0.7)
HCT: 38.4 % (ref 36.0–46.0)
Hemoglobin: 13.1 g/dL (ref 12.0–15.0)
Lymphocytes Relative: 33 % (ref 12–46)
Lymphs Abs: 2.8 10*3/uL (ref 0.7–4.0)
MCH: 29.9 pg (ref 26.0–34.0)
MCHC: 34.1 g/dL (ref 30.0–36.0)
MCV: 87.7 fL (ref 78.0–100.0)
MONO ABS: 0.5 10*3/uL (ref 0.1–1.0)
Monocytes Relative: 6 % (ref 3–12)
Neutro Abs: 5 10*3/uL (ref 1.7–7.7)
Neutrophils Relative %: 59 % (ref 43–77)
PLATELETS: 181 10*3/uL (ref 150–400)
RBC: 4.38 MIL/uL (ref 3.87–5.11)
RDW: 14.1 % (ref 11.5–15.5)
WBC: 8.4 10*3/uL (ref 4.0–10.5)

## 2015-02-07 LAB — LIPASE, BLOOD: Lipase: 22 U/L (ref 22–51)

## 2015-02-07 LAB — URINALYSIS, ROUTINE W REFLEX MICROSCOPIC
BILIRUBIN URINE: NEGATIVE
GLUCOSE, UA: NEGATIVE mg/dL
KETONES UR: NEGATIVE mg/dL
Nitrite: NEGATIVE
Protein, ur: NEGATIVE mg/dL
Specific Gravity, Urine: 1.009 (ref 1.005–1.030)
Urobilinogen, UA: 0.2 mg/dL (ref 0.0–1.0)
pH: 6 (ref 5.0–8.0)

## 2015-02-07 LAB — COMPREHENSIVE METABOLIC PANEL
ALT: 24 U/L (ref 14–54)
ANION GAP: 8 (ref 5–15)
AST: 29 U/L (ref 15–41)
Albumin: 4.1 g/dL (ref 3.5–5.0)
Alkaline Phosphatase: 100 U/L (ref 38–126)
BILIRUBIN TOTAL: 1 mg/dL (ref 0.3–1.2)
BUN: 10 mg/dL (ref 6–20)
CO2: 23 mmol/L (ref 22–32)
Calcium: 9.4 mg/dL (ref 8.9–10.3)
Chloride: 106 mmol/L (ref 101–111)
Creatinine, Ser: 0.79 mg/dL (ref 0.44–1.00)
GFR calc non Af Amer: >60 mL/min (ref >60)
Glucose, Bld: 107 mg/dL — ABNORMAL HIGH (ref 65–99)
POTASSIUM: 4.7 mmol/L (ref 3.5–5.1)
Sodium: 137 mmol/L (ref 135–145)
TOTAL PROTEIN: 6.7 g/dL (ref 6.5–8.1)

## 2015-02-07 LAB — PREGNANCY, URINE: Preg Test, Ur: NEGATIVE

## 2015-02-07 LAB — URINE MICROSCOPIC-ADD ON

## 2015-02-07 MED ORDER — MORPHINE SULFATE (PF) 4 MG/ML IV SOLN
4.0000 mg | Freq: Once | INTRAVENOUS | Status: AC
  Administered 2015-02-07: 4 mg via INTRAVENOUS
  Filled 2015-02-07: qty 1

## 2015-02-07 MED ORDER — METFORMIN HCL 500 MG PO TABS: 500.0000 mg | ORAL_TABLET | ORAL | Status: DC

## 2015-02-07 MED ORDER — CEPHALEXIN 500 MG PO CAPS: 500.0000 mg | ORAL_CAPSULE | Freq: Four times a day (QID) | ORAL | Status: DC

## 2015-02-07 MED ORDER — SODIUM CHLORIDE 0.9 % IV BOLUS (SEPSIS)
1000.0000 mL | Freq: Once | INTRAVENOUS | Status: AC
  Administered 2015-02-07: 1000 mL via INTRAVENOUS

## 2015-02-07 MED ORDER — DEXTROSE 5 % IV SOLN
1.0000 g | Freq: Once | INTRAVENOUS | Status: AC
  Administered 2015-02-07: 1 g via INTRAVENOUS
  Filled 2015-02-07: qty 10

## 2015-02-07 MED ORDER — OXYCODONE-ACETAMINOPHEN 5-325 MG PO TABS: 1.0000 | ORAL_TABLET | Freq: Four times a day (QID) | ORAL | Status: DC | PRN

## 2015-02-07 MED ORDER — PROMETHAZINE HCL 25 MG/ML IJ SOLN
12.5000 mg | Freq: Once | INTRAMUSCULAR | Status: AC
  Administered 2015-02-07: 12.5 mg via INTRAVENOUS
  Filled 2015-02-07: qty 1

## 2015-02-07 NOTE — ED Notes (Addendum)
Left sided kidney pain; hx of stones, cysts, and UTI issues. NO PCP and has been taking diabetes meds but is running out soon. Having neuropathy in legs as well.

## 2015-02-07 NOTE — ED Provider Notes (Addendum)
CSN: [id number]     Arrival date & time 02/07/15  0758 History   First MD Initiated Contact with Patient 02/07/15 [phone]     Chief Complaint  Patient presents with  . Flank Pain     (Consider location/radiation/quality/duration/timing/severity/associated sxs/prior Treatment) HPI.... Left flank pain for 24 hours with radiation to left lateral mid abdomen. Patient has a history of kidney stones and urinary tract infection. No fever, sweats, chills, immature area. [place] and states she travels through [place] often for her job. He has apparently run out of lots from medications. She has been eating. Severity of pain is moderate.  Past Medical History  Diagnosis Date  . Hypertension   . Diabetes mellitus without complication   . Anxiety   . Panic attack   . ADHD (attention deficit hyperactivity disorder)   . Stomach ulcer   . Ovarian cyst   . Kidney stone   . H/O degenerative disc disease    Past Surgical History  Procedure Laterality Date  . Cesarean section    . Tonsillectomy    . Tubal ligation     History reviewed. No pertinent family history. Social History  Substance Use Topics  . Smoking status: Current Every Day Smoker -- 1.00 packs/day    Types: Cigarettes  . Smokeless tobacco: None  . Alcohol Use: No   OB History    No data available     Review of Systems  All other systems reviewed and are negative.     Allergies  Ketorolac; Nsaids; Penicillins; Tramadol; and Zofran  Home Medications   Prior to Admission medications   Medication Sig Start Date End Date Taking? Authorizing Provider  acetaminophen (TYLENOL) 500 MG tablet Take 500 mg by mouth every 6 (six) hours as needed for mild pain.   Yes Historical Provider, MD  albuterol (PROVENTIL HFA;VENTOLIN HFA) 108 (90 BASE) MCG/ACT inhaler Inhale 1 puff into the lungs every 6 (six) hours as needed for wheezing or shortness of breath.   Yes Historical Provider, MD  ALPRAZolam  [name]) 1 MG tablet Take 1 mg by mouth 3 (three) times daily.   Yes Historical Provider, MD  amLODipine (NORVASC) 10 MG tablet Take 10 mg by mouth daily.   Yes Historical Provider, MD  aspirin 81 MG chewable tablet Chew 81 mg by mouth daily.   Yes Historical Provider, MD  butalbital-acetaminophen-caffeine (FIORICET WITH CODEINE) 50-325-40-30 MG per capsule Take 1 capsule by mouth every 4 (four) hours as needed for headache.   Yes Historical Provider, MD  lisdexamfetamine (VYVANSE) 50 MG capsule Take 50 mg by mouth daily.   Yes Historical Provider, MD  lisinopril (PRINIVIL,ZESTRIL) 10 MG tablet Take 10 mg by mouth daily.   Yes Historical Provider, MD  cephALEXin (KEFLEX) 500 MG capsule Take 1 capsule (500 mg total) by mouth 4 (four) times daily. 02/07/15   [name], MD  HYDROcodone-acetaminophen (NORCO/VICODIN) 5-325 MG per tablet Take 1-2 tablets by mouth every 6 (six) hours as needed. Patient not taking: Reported on 02/07/2015 09/16/14   [name], NP  metFORMIN (GLUCOPHAGE) 500 MG tablet Take 1 tablet (500 mg total) by mouth daily after supper. 02/07/15   [name], MD  oxyCODONE-acetaminophen (PERCOCET/ROXICET) 5-325 MG per tablet Take 1-2 tablets by mouth every 6 (six) hours as needed. 02/07/15   [name], MD  traMADol (ULTRAM) 50 MG tablet Take 1 tablet (50 mg total) by mouth every 6 (six) hours as needed. Patient not taking: Reported on  02/07/2015 12/06/14   [name], PA-C   BP 110/75 mmHg  Pulse 93  Temp(Src) 98.5 F (36.9 C) (Oral)  Resp 16  SpO2 95%  LMP 01/31/2015 Physical Exam  Constitutional: She is oriented to person, place, and time.  obese  HENT:  Head: Normocephalic and atraumatic.  Eyes: Conjunctivae and EOM are normal. Pupils are equal, round, and reactive to light.  Neck: Normal range of motion. Neck supple.  Cardiovascular: Normal rate and regular rhythm.   Pulmonary/Chest: Effort normal and breath sounds normal.  Abdominal: Soft. Bowel sounds are normal.   Min left lat abd tenderness  Musculoskeletal: Normal range of motion.  Neurological: She is alert and oriented to person, place, and time.  Skin: Skin is warm and dry.  Psychiatric: She has a normal mood and affect. Her behavior is normal.  Nursing note and vitals reviewed.   ED Course  Procedures (including critical care time) Labs Review Labs Reviewed  URINALYSIS, ROUTINE W REFLEX MICROSCOPIC (NOT AT [facility]) - Abnormal; Notable for the following:    Color, Urine RED (*)    APPearance CLOUDY (*)    Hgb urine dipstick LARGE (*)    Leukocytes, UA SMALL (*)    All other components within normal limits  COMPREHENSIVE METABOLIC PANEL - Abnormal; Notable for the following:    Glucose, Bld 107 (*)    All other components within normal limits  URINE MICROSCOPIC-ADD ON - Abnormal; Notable for the following:    Squamous Epithelial / LPF FEW (*)    Bacteria, UA MANY (*)    All other components within normal limits  URINE CULTURE  PREGNANCY, URINE  LIPASE, BLOOD  CBC WITH DIFFERENTIAL/PLATELET  CBC WITH DIFFERENTIAL/PLATELET    Imaging Review Ct Renal Stone Study  02/07/2015   CLINICAL DATA:  Left flank pain.  EXAM: CT ABDOMEN AND PELVIS WITHOUT CONTRAST  TECHNIQUE: Multidetector CT imaging of the abdomen and pelvis was performed following the standard protocol without IV contrast.  COMPARISON:  CT scan of September 24, 2014.  FINDINGS: Visualized lung bases appear normal. No significant osseous abnormality is noted.  No gallstones are noted. Fatty infiltration of the liver is noted with sparing around the gallbladder fossa. Mild hepatomegaly is noted. The spleen and pancreas appear normal. Adrenal glands and kidneys appear normal. No hydronephrosis or renal obstruction is noted. No renal or ureteral calculi are noted. The appendix appears normal. There is no evidence of bowel obstruction. No abnormal fluid collection is noted. Uterus and ovaries appear normal. Urinary bladder appears normal. No  significant adenopathy is noted.  IMPRESSION: Mild hepatomegaly is noted with diffuse fatty infiltration. No hydronephrosis or renal obstruction is noted. No renal or ureteral calculi are noted.   Electronically Signed   By: Lupita Raider, M.D.   On: 02/07/2015 14:02   I have personally reviewed and evaluated these images and lab results as part of my medical decision-making.   EKG Interpretation None      MDM   Final diagnoses:  Left flank pain  UTI (lower urinary tract infection)    Patient presents with left flank pain. CT shows no obvious stone. No acute abdomen. Discharge medications metformin 500 mg, Keflex 500 mg, Percocet    [name], MD 02/09/15 1231  [name], MD 03/01/15 1327

## 2015-02-07 NOTE — ED Notes (Signed)
Patient given [name].

## 2015-02-07 NOTE — Discharge Instructions (Signed)
You have a urinary infection. No obvious kidney stone. Prescriptions for metformin, pain medicine, antibiotic.

## 2015-02-09 DIAGNOSIS — R87619 Unspecified abnormal cytological findings in specimens from cervix uteri: Secondary | ICD-10-CM | POA: Insufficient documentation

## 2015-02-09 DIAGNOSIS — F1320 Sedative, hypnotic or anxiolytic dependence, uncomplicated: Secondary | ICD-10-CM | POA: Insufficient documentation

## 2015-02-09 DIAGNOSIS — M5030 Other cervical disc degeneration, unspecified cervical region: Secondary | ICD-10-CM | POA: Insufficient documentation

## 2015-02-09 DIAGNOSIS — E669 Obesity, unspecified: Secondary | ICD-10-CM | POA: Insufficient documentation

## 2015-02-09 LAB — URINE CULTURE

## 2015-02-10 NOTE — Progress Notes (Signed)
ED Antimicrobial Stewardship Positive Culture Follow Up   Haley Howell is an 29 y.o. female who presented to [facility] on 02/07/2015 with a chief complaint of  Chief Complaint  Patient presents with  . Flank Pain    Recent Results (from the past 720 hour(s))  Urine culture     Status: None   Collection Time: 02/07/15  8:30 AM  Result Value Ref Range Status   Specimen Description URINE, RANDOM  Final   Special Requests Immunocompromised  Final   Culture >=100,000 COLONIES/mL ESCHERICHIA COLI  Final   Report Status 02/09/2015 FINAL  Final   Organism ID, Bacteria ESCHERICHIA COLI  Final      Susceptibility   Escherichia coli - MIC*    AMPICILLIN >=32 RESISTANT Resistant     CEFAZOLIN >=64 RESISTANT Resistant     CEFTRIAXONE >=64 RESISTANT Resistant     CIPROFLOXACIN >=4 RESISTANT Resistant     GENTAMICIN <=1 SENSITIVE Sensitive     IMIPENEM <=0.25 SENSITIVE Sensitive     NITROFURANTOIN <=16 SENSITIVE Sensitive     TRIMETH/SULFA <=20 SENSITIVE Sensitive     AMPICILLIN/SULBACTAM >=32 RESISTANT Resistant     PIP/TAZO 8 SENSITIVE Sensitive     * >=100,000 COLONIES/mL ESCHERICHIA COLI     Treated with Cephalexin, organism resistant to prescribed antimicrobial  New antibiotic prescription: Bactrim 1 DS tab PO BID x 3 days  ED Provider: Trixie Dredge, PA-C   Newton Pigg 02/10/2015, 9:25 AM Infectious Diseases Pharmacist Phone# [phone]

## 2015-02-11 ENCOUNTER — Telehealth (HOSPITAL_COMMUNITY): Payer: Self-pay | Admitting: Emergency Medicine

## 2015-02-13 ENCOUNTER — Telehealth (HOSPITAL_COMMUNITY): Payer: Self-pay

## 2015-02-15 ENCOUNTER — Telehealth (HOSPITAL_COMMUNITY): Payer: Self-pay | Admitting: Emergency Medicine

## 2015-02-15 NOTE — Telephone Encounter (Signed)
Post ED Visit - Positive Culture Follow-up: Unsuccessful Patient Follow-up  Culture assessed and recommendations reviewed by:  Wes Dulaney, Pharm.D., BCPS  Celedonio Miyamoto, Pharm.D., BCPS  Georgina Pillion, Pharm.D., BCPS  [place], [address].D., BCPS, AAHIVP  Estella Husk, Pharm.D., BCPS, AAHIVP  Red Christians, Pharm.D.  Tennis Must, [place].D.  Positive Urine culture   Patient discharged without antimicrobial prescription and treatment is now indicated  Organism is resistant to prescribed ED discharge antimicrobial  Patient with positive blood cultures   Unable to contact patient after 3 attempts, letter will be sent to address on file  Haley Howell 02/15/2015, 11:31 AM

## 2015-04-01 ENCOUNTER — Telehealth (HOSPITAL_COMMUNITY): Payer: Self-pay

## 2015-04-01 NOTE — Telephone Encounter (Signed)
Unable to contact pt by mail or telephone. Unable to communicate lab results or treatment changes. 

## 2015-06-09 DIAGNOSIS — J189 Pneumonia, unspecified organism: Secondary | ICD-10-CM | POA: Insufficient documentation

## 2015-07-21 DIAGNOSIS — G4733 Obstructive sleep apnea (adult) (pediatric): Secondary | ICD-10-CM | POA: Insufficient documentation

## 2015-08-03 ENCOUNTER — Emergency Department (HOSPITAL_BASED_OUTPATIENT_CLINIC_OR_DEPARTMENT_OTHER)
Admission: EM | Admit: 2015-08-03 | Discharge: 2015-08-04 | Disposition: A | Discharge status: Home / Self Care | Payer: Medicaid Other | Attending: Emergency Medicine | Admitting: Emergency Medicine

## 2015-08-03 ENCOUNTER — Encounter: Payer: Self-pay | Admitting: Emergency Medicine

## 2015-08-03 ENCOUNTER — Encounter (HOSPITAL_BASED_OUTPATIENT_CLINIC_OR_DEPARTMENT_OTHER): Payer: Self-pay | Admitting: *Deleted

## 2015-08-03 ENCOUNTER — Emergency Department (HOSPITAL_BASED_OUTPATIENT_CLINIC_OR_DEPARTMENT_OTHER): Payer: Medicaid Other

## 2015-08-03 ENCOUNTER — Emergency Department (INDEPENDENT_AMBULATORY_CARE_PROVIDER_SITE_OTHER)
Admission: EM | Admit: 2015-08-03 | Discharge: 2015-08-03 | Payer: Medicaid Other | Source: Home / Self Care | Attending: Family Medicine | Admitting: Family Medicine

## 2015-08-03 DIAGNOSIS — R600 Localized edema: Secondary | ICD-10-CM | POA: Insufficient documentation

## 2015-08-03 DIAGNOSIS — M7989 Other specified soft tissue disorders: Secondary | ICD-10-CM | POA: Diagnosis present

## 2015-08-03 DIAGNOSIS — F419 Anxiety disorder, unspecified: Secondary | ICD-10-CM

## 2015-08-03 DIAGNOSIS — M79604 Pain in right leg: Secondary | ICD-10-CM

## 2015-08-03 DIAGNOSIS — Z7982 Long term (current) use of aspirin: Secondary | ICD-10-CM | POA: Diagnosis not present

## 2015-08-03 DIAGNOSIS — F410 Panic disorder [episodic paroxysmal anxiety] without agoraphobia: Secondary | ICD-10-CM | POA: Diagnosis not present

## 2015-08-03 DIAGNOSIS — Z880 Allergy status to penicillin: Secondary | ICD-10-CM | POA: Insufficient documentation

## 2015-08-03 DIAGNOSIS — Z792 Long term (current) use of antibiotics: Secondary | ICD-10-CM | POA: Diagnosis not present

## 2015-08-03 DIAGNOSIS — F909 Attention-deficit hyperactivity disorder, unspecified type: Secondary | ICD-10-CM | POA: Insufficient documentation

## 2015-08-03 DIAGNOSIS — R739 Hyperglycemia, unspecified: Secondary | ICD-10-CM

## 2015-08-03 DIAGNOSIS — E1165 Type 2 diabetes mellitus with hyperglycemia: Secondary | ICD-10-CM | POA: Insufficient documentation

## 2015-08-03 DIAGNOSIS — L539 Erythematous condition, unspecified: Secondary | ICD-10-CM | POA: Insufficient documentation

## 2015-08-03 DIAGNOSIS — R110 Nausea: Secondary | ICD-10-CM | POA: Diagnosis not present

## 2015-08-03 DIAGNOSIS — I10 Essential (primary) hypertension: Secondary | ICD-10-CM | POA: Diagnosis not present

## 2015-08-03 DIAGNOSIS — Z79899 Other long term (current) drug therapy: Secondary | ICD-10-CM | POA: Insufficient documentation

## 2015-08-03 DIAGNOSIS — Z87442 Personal history of urinary calculi: Secondary | ICD-10-CM | POA: Diagnosis not present

## 2015-08-03 DIAGNOSIS — Z8719 Personal history of other diseases of the digestive system: Secondary | ICD-10-CM | POA: Diagnosis not present

## 2015-08-03 DIAGNOSIS — F17210 Nicotine dependence, cigarettes, uncomplicated: Secondary | ICD-10-CM | POA: Insufficient documentation

## 2015-08-03 DIAGNOSIS — R609 Edema, unspecified: Secondary | ICD-10-CM

## 2015-08-03 DIAGNOSIS — Z8742 Personal history of other diseases of the female genital tract: Secondary | ICD-10-CM | POA: Diagnosis not present

## 2015-08-03 LAB — CBC WITH DIFFERENTIAL/PLATELET
Basophils Absolute: 0 10*3/uL (ref 0.0–0.1)
Basophils Relative: 0 %
EOS PCT: 3 %
Eosinophils Absolute: 0.2 10*3/uL (ref 0.0–0.7)
HCT: 36.5 % (ref 36.0–46.0)
Hemoglobin: 12.1 g/dL (ref 12.0–15.0)
LYMPHS ABS: 2.8 10*3/uL (ref 0.7–4.0)
Lymphocytes Relative: 47 %
MCH: 29.2 pg (ref 26.0–34.0)
MCHC: 33.2 g/dL (ref 30.0–36.0)
MCV: 88.2 fL (ref 78.0–100.0)
MONO ABS: 0.6 10*3/uL (ref 0.1–1.0)
Monocytes Relative: 11 %
Neutro Abs: 2.3 10*3/uL (ref 1.7–7.7)
Neutrophils Relative %: 39 %
PLATELETS: 193 10*3/uL (ref 150–400)
RBC: 4.14 MIL/uL (ref 3.87–5.11)
RDW: 14 % (ref 11.5–15.5)
WBC: 6 10*3/uL (ref 4.0–10.5)

## 2015-08-03 LAB — PROTIME-INR
INR: 0.93 (ref 0.00–1.49)
Prothrombin Time: 12.7 seconds (ref 11.6–15.2)

## 2015-08-03 NOTE — ED Provider Notes (Signed)
CSN: [id number]     Arrival date & time 08/03/15  2024 History  By signing my name below, I, Haley Howell, attest that this documentation has been prepared under the direction and in the presence of Linwood DibblesJon Lamondre Wesche, MD. Electronically Signed: Gonzella LexKimberly Bianca Howell, Scribe. 08/03/2015. 11:01 PM.  Chief Complaint  Patient presents with  . Leg Pain   The history is provided by the patient. No language interpreter was used.    HPI Comments: [name] is a 30 y.o. female with a hx of anxiety, who presents to the Emergency Department complaining of sudden onset, gradually worsening, bilateral leg swelling which began about four days ago. Pt also notes associated nausea and warmth and erythema to her right, lower extermity. She also reports that she fell onto her knees earlier today when her knee locked up. Pt reported to the urgent care in [place] earlier today and was advised to report to the ED because the physician suspected that she may have a blood clot in her right leg. Pt denies fever, chills, chest pain, SOB, and a hx of blod clot   Past Medical History  Diagnosis Date  . Hypertension   . Diabetes mellitus without complication (HCC)   . Anxiety   . Panic attack   . ADHD (attention deficit hyperactivity disorder)   . Stomach ulcer   . Ovarian cyst   . Kidney stone   . H/O degenerative disc disease    Past Surgical History  Procedure Laterality Date  . Cesarean section    . Tonsillectomy    . Tubal ligation     No family history on file. Social History  Substance Use Topics  . Smoking status: Current Every Day Smoker -- 1.00 packs/day    Types: Cigarettes  . Smokeless tobacco: None  . Alcohol Use: No   OB History    No data available     Review of Systems  Constitutional: Negative for fever and chills.  Respiratory: Negative for shortness of breath.   Cardiovascular: Positive for leg swelling. Negative for chest pain.  Gastrointestinal: Positive for nausea.   Skin: Positive for color change ( erythematous right knee).  Psychiatric/Behavioral: The patient is nervous/anxious.   A complete 10 system review of systems was obtained and all systems are negative except as noted in the HPI and PMH.   Allergies  Ketorolac; Nsaids; Penicillins; Tramadol; and Zofran  Home Medications   Prior to Admission medications   Medication Sig Start Date End Date Taking? Authorizing Provider  acetaminophen (TYLENOL) 500 MG tablet Take 500 mg by mouth every 6 (six) hours as needed for mild pain.    Historical Provider, MD  albuterol (PROVENTIL HFA;VENTOLIN HFA) 108 (90 BASE) MCG/ACT inhaler Inhale 1 puff into the lungs every 6 (six) hours as needed for wheezing or shortness of breath.    Historical Provider, MD  ALPRAZolam [name](XANAX) 1 MG tablet Take 1 mg by mouth 3 (three) times daily.    Historical Provider, MD  amLODipine (NORVASC) 10 MG tablet Take 10 mg by mouth daily.    Historical Provider, MD  aspirin 81 MG chewable tablet Chew 81 mg by mouth daily.    Historical Provider, MD  butalbital-acetaminophen-caffeine (FIORICET WITH CODEINE) 50-325-40-30 MG per capsule Take 1 capsule by mouth every 4 (four) hours as needed for headache.    Historical Provider, MD  cephALEXin (KEFLEX) 500 MG capsule Take 1 capsule (500 mg total) by mouth 4 (four) times daily. 02/07/15   [name]  [name], MD  HYDROcodone-acetaminophen (NORCO/VICODIN) 5-325 MG per tablet Take 1-2 tablets by mouth every 6 (six) hours as needed. Patient not taking: Reported on 02/07/2015 09/16/14   [name], NP  lisdexamfetamine (VYVANSE) 50 MG capsule Take 50 mg by mouth daily.    Historical Provider, MD  lisinopril (PRINIVIL,ZESTRIL) 10 MG tablet Take 10 mg by mouth daily.    Historical Provider, MD  metFORMIN (GLUCOPHAGE) 500 MG tablet Take 1 tablet (500 mg total) by mouth 2 (two) times daily with a meal. 08/04/15   [name], MD  oxyCODONE-acetaminophen (PERCOCET/ROXICET) 5-325 MG per tablet Take 1-2 tablets  by mouth every 6 (six) hours as needed. 02/07/15   [name], MD  traMADol (ULTRAM) 50 MG tablet Take 1 tablet (50 mg total) by mouth every 6 (six) hours as needed. Patient not taking: Reported on 02/07/2015 12/06/14   [name], PA-C   BP 100/58 mmHg  Pulse 90  Temp(Src) 98.8 F (37.1 C) (Oral)  Resp 18  Ht  (1.676 m)  Wt 102.513 kg  BMI 36.49 kg/m2  SpO2 93%  LMP 07/27/2015 (Approximate) Physical Exam  Constitutional: She appears well-developed and well-nourished. No distress.  HENT:  Head: Normocephalic and atraumatic.  Right Ear: External ear normal.  Left Ear: External ear normal.  Eyes: Conjunctivae are normal. Right eye exhibits no discharge. Left eye exhibits no discharge. No scleral icterus.  Neck: Neck supple. No tracheal deviation present.  Cardiovascular: Normal rate.   Pulmonary/Chest: Effort normal. No stridor. No respiratory distress.  Musculoskeletal:       Right knee: Normal.       Right lower leg: She exhibits tenderness, swelling and edema.  Neurological: She is alert. Cranial nerve deficit: no gross deficits.  Skin: Skin is warm and dry. No rash noted.  Psychiatric: Her mood appears anxious.  Nursing note and vitals reviewed.  ED Course  Procedures  DIAGNOSTIC STUDIES:    Oxygen Saturation is 94% on RA, adequate by my interpretation.  COORDINATION OF CARE:  11:02 PM Will order an ultra-sound and labs. Discussed treatment plan with pt at bedside and pt agreed to plan.   Labs Review Labs Reviewed  BASIC METABOLIC PANEL - Abnormal; Notable for the following:    Sodium 129 (*)    Chloride 94 (*)    Glucose, Bld 379 (*)    BUN <5 (*)    Calcium 8.7 (*)    All other components within normal limits  CBC WITH DIFFERENTIAL/PLATELET  PROTIME-INR   Imaging Review US Venous Img Lower Unilateral Right  08/03/2015  CLINICAL DATA:  Right leg swelling. Fall earlier today. Question DVT. EXAM: RIGHT LOWER EXTREMITY VENOUS DOPPLER ULTRASOUND TECHNIQUE:  Howell-scale sonography with graded compression, as well as color Doppler and duplex ultrasound were performed to evaluate the lower extremity deep venous systems from the level of the common femoral vein and including the common femoral, femoral, profunda femoral, popliteal and calf veins including the posterior tibial, peroneal and gastrocnemius veins when visible. The superficial great saphenous vein was also interrogated. Spectral Doppler was utilized to evaluate flow at rest and with distal augmentation maneuvers in the common femoral, femoral and popliteal veins. COMPARISON:  None. FINDINGS: Contralateral Common Femoral Vein: Respiratory phasicity is normal and symmetric with the symptomatic side. No evidence of thrombus. Normal compressibility. Common Femoral Vein: No evidence of thrombus. Normal compressibility, respiratory phasicity and response to augmentation. Saphenofemoral Junction: No evidence of thrombus. Normal compressibility and flow on color Doppler imaging. Profunda Femoral Vein:  No evidence of thrombus. Normal compressibility and flow on color Doppler imaging. Femoral Vein: No evidence of thrombus. Normal compressibility, respiratory phasicity and response to augmentation. Popliteal Vein: No evidence of thrombus. Normal compressibility, respiratory phasicity and response to augmentation. Calf Veins: No evidence of thrombus. Normal compressibility and flow on color Doppler imaging. Superficial Great Saphenous Vein: No evidence of thrombus. Normal compressibility and flow on color Doppler imaging. Venous Reflux:  None. Other Findings:  Soft tissue edema in the calf. IMPRESSION: No evidence of right lower extremity deep venous thrombosis. Electronically Signed   By: Rubye Oaks M.D.   On: 08/03/2015 23:54   I have personally reviewed and evaluated these images and lab results as part of my medical decision-making.  MDM   Final diagnoses:  Hyperglycemia  Peripheral edema  Anxiety    Patient's Doppler ultrasound does not show any evidence of DVT. Exam does not suggest cellulitis or infection.  Patient does have hyperglycemia. No evidence of DKA. I will increase her metformin to twice daily. She was given a dose of insulin here in the emergency room. I recommend follow-up with the primary doctor  Ms. Buss has anxiety issues. Her doctor used to prescribe her Xanax and no longer is providing that prescription. Patient requested a refill. I explained to her that I did not recommend this medication for anxiety.  This medication should be prescribed  through the care of mental health professional. I will give her recommendations for outpatient follow-up  [name], MD 08/04/15 0008

## 2015-08-03 NOTE — ED Provider Notes (Signed)
CSN: [id number]     Arrival date & time 08/03/15  1857 History   None    Chief Complaint  Patient presents with  . Knee Pain  . Anxiety     HPI Comments: Patient reports that her right knee "locked up" today and she fell.  She admits that she has had pain/swelling in her right lower leg for several days.   She denies chest pain or shortness of breath. She also complains of anxiety, and requests refill of her medication.  The history is provided by the patient.    Past Medical History  Diagnosis Date  . Hypertension   . Diabetes mellitus without complication (HCC)   . Anxiety   . Panic attack   . ADHD (attention deficit hyperactivity disorder)   . Stomach ulcer   . Ovarian cyst   . Kidney stone   . H/O degenerative disc disease    Past Surgical History  Procedure Laterality Date  . Cesarean section    . Tonsillectomy    . Tubal ligation     History reviewed. No pertinent family history. Social History  Substance Use Topics  . Smoking status: Current Every Day Smoker -- 1.00 packs/day    Types: Cigarettes  . Smokeless tobacco: None  . Alcohol Use: No   OB History    No data available     Review of Systems  Constitutional: Positive for fatigue. Negative for fever, chills and diaphoresis.  HENT: Negative.   Eyes: Negative.   Respiratory: Negative for cough, chest tightness, shortness of breath and wheezing.   Cardiovascular: Positive for leg swelling. Negative for chest pain and palpitations.  Gastrointestinal: Negative.   Genitourinary: Negative.   Musculoskeletal: Positive for arthralgias.  Skin: Positive for color change. Negative for rash.  Neurological: Negative for dizziness and headaches.    Allergies  Ketorolac; Nsaids; Penicillins; Tramadol; and Zofran  Home Medications   Prior to Admission medications   Medication Sig Start Date End Date Taking? Authorizing Provider  acetaminophen (TYLENOL) 500 MG tablet Take 500 mg by mouth every 6 (six) hours as  needed for mild pain.    Historical Provider, MD  albuterol (PROVENTIL HFA;VENTOLIN HFA) 108 (90 BASE) MCG/ACT inhaler Inhale 1 puff into the lungs every 6 (six) hours as needed for wheezing or shortness of breath.    Historical Provider, MD  ALPRAZolam [name]) 1 MG tablet Take 1 mg by mouth 3 (three) times daily.    Historical Provider, MD  amLODipine (NORVASC) 10 MG tablet Take 10 mg by mouth daily.    Historical Provider, MD  aspirin 81 MG chewable tablet Chew 81 mg by mouth daily.    Historical Provider, MD  butalbital-acetaminophen-caffeine (FIORICET WITH CODEINE) 50-325-40-30 MG per capsule Take 1 capsule by mouth every 4 (four) hours as needed for headache.    Historical Provider, MD  cephALEXin (KEFLEX) 500 MG capsule Take 1 capsule (500 mg total) by mouth 4 (four) times daily. 02/07/15   [name], MD  HYDROcodone-acetaminophen (NORCO/VICODIN) 5-325 MG per tablet Take 1-2 tablets by mouth every 6 (six) hours as needed. Patient not taking: Reported on 02/07/2015 09/16/14   [name], NP  lisdexamfetamine (VYVANSE) 50 MG capsule Take 50 mg by mouth daily.    Historical Provider, MD  lisinopril (PRINIVIL,ZESTRIL) 10 MG tablet Take 10 mg by mouth daily.    Historical Provider, MD  metFORMIN (GLUCOPHAGE) 500 MG tablet Take 1 tablet (500 mg total) by mouth 2 (two) times daily with a meal.  08/04/15   Linwood DibblesJon Knapp, MD  oxyCODONE-acetaminophen (PERCOCET/ROXICET) 5-325 MG per tablet Take 1-2 tablets by mouth every 6 (six) hours as needed. 02/07/15   Donnetta HutchingBrian Cook, MD  traMADol (ULTRAM) 50 MG tablet Take 1 tablet (50 mg total) by mouth every 6 (six) hours as needed. Patient not taking: Reported on 02/07/2015 12/06/14   Junius FinnerErin O'Malley, PA-C   Meds Ordered and Administered this Visit  Medications - No data to display  BP 126/77 mmHg  Pulse 107  Temp(Src) 98.4 F (36.9 C) (Oral)  Resp 18  Ht 5\' 6"  (1.676 m)  Wt 226 lb (102.513 kg)  BMI 36.49 kg/m2  SpO2 95%  LMP 07/27/2015 (Approximate) No data  found.   Physical Exam  Constitutional: She is oriented to person, place, and time. She appears well-developed and well-nourished. No distress.  HENT:  Head: Normocephalic.  Eyes: Pupils are equal, round, and reactive to light.  Neck: Neck supple.  Cardiovascular: Normal heart sounds.   Pulmonary/Chest: Breath sounds normal.  Abdominal: There is no tenderness.  Musculoskeletal: She exhibits edema and tenderness.       Right lower leg: She exhibits tenderness, swelling and edema. She exhibits no bony tenderness, no deformity and no laceration.       Legs: Patient's right lower leg below the knee is dusky, warm, tense, and diffusely tender to palpation.  Her pedal pulses, however, are intact.  Sensation intact.  Neurological: She is alert and oriented to person, place, and time.  Skin: Skin is warm and dry. She is not diaphoretic.  Psychiatric:  Psychiatric:  Patient appears anxious but is alert and oriented with good eye contact.  Thoughts are organized.  No psychomotor retardation.  Memory intact.  Not suicidal.    Nursing note and vitals reviewed.   ED Course  Procedures none  MDM   1. Pain of right lower extremity; concern for possible DVT    Patient advised to proceed immediately to local hospital emergency room for further evaluation.  She has transportation.  Her vital signs are stable for travel in private vehicle    Lattie HawStephen A Beese, MD 08/08/15 [phone]

## 2015-08-03 NOTE — ED Notes (Signed)
Pain in her right leg. Redness to her right leg and swelling to both legs. States she fell today and hurt her lower back and right knee.

## 2015-08-03 NOTE — ED Notes (Signed)
Reports right knee pain today. Also wants refill for anxiety medication.

## 2015-08-04 LAB — BASIC METABOLIC PANEL
Anion gap: 11 (ref 5–15)
CHLORIDE: 94 mmol/L — AB (ref 101–111)
CO2: 24 mmol/L (ref 22–32)
Calcium: 8.7 mg/dL — ABNORMAL LOW (ref 8.9–10.3)
Creatinine, Ser: 0.73 mg/dL (ref 0.44–1.00)
GFR calc Af Amer: >60 mL/min (ref >60)
GFR calc non Af Amer: >60 mL/min (ref >60)
Glucose, Bld: 379 mg/dL — ABNORMAL HIGH (ref 65–99)
Potassium: 3.7 mmol/L (ref 3.5–5.1)
SODIUM: 129 mmol/L — AB (ref 135–145)

## 2015-08-04 MED ORDER — INSULIN ASPART 100 UNIT/ML ~~LOC~~ SOLN
6.0000 [IU] | Freq: Once | SUBCUTANEOUS | Status: AC
  Administered 2015-08-04: 6 [IU] via SUBCUTANEOUS
  Filled 2015-08-04: qty 1

## 2015-08-04 MED ORDER — PROMETHAZINE HCL 25 MG PO TABS
25.0000 mg | ORAL_TABLET | Freq: Once | ORAL | Status: AC
  Administered 2015-08-04: 25 mg via ORAL
  Filled 2015-08-04: qty 1

## 2015-08-04 MED ORDER — METFORMIN HCL 500 MG PO TABS: 500.0000 mg | ORAL_TABLET | Freq: Two times a day (BID) | ORAL | Status: DC

## 2015-08-04 NOTE — Discharge Instructions (Signed)
Substance Abuse Treatment Programs ° °Intensive Outpatient Programs °[facility]     °[address]      °[place], [place]                   °[phone]      ° °[facility] °[address] °[place], [place] °[phone] ° °[facility]     °(Inpatient and outpatient)     °[address]           °[phone]   ° °[facility] °[phone] (Suboxone and Methadone) ° °[address]      °[place], [place] [address]      °[phone]      ° °[address] °[place], [place] °852-3033 ° °[facility] (Outpatient/Inpatient, [facility])    °(insurance only) [phone]      °       °[facility]Groups & Residential) °[place] °[phone] ° °   °[facility]     °[address]     °[place], [place]      °[phone]      ° °[facility] (for caregivers and family) °[address] °[place], [place] °336-299-4655 ° ° ° ° ° °Residential Treatment Programs °[facility]      °[address], [place], [place] [address]  °[phone]      ° °[facility] °[address] [place], [place] [address] °[phone] ° °[facility]        °[phone]      ° °[facility] °[phone] ° °[facility]             °[address]                                         °[place], [place]                                                °[phone] or [phone]                              ° °[facility] [place] °[address] °[place], [address] °[phone] ° °[facility]    °[address]      °[place], [place]     °[phone]      ° °[facility] °[address] °, [place] °[phone] ° °[facility]   °[address]     °[place], [place] [address]     °[phone]      °Admissions: 8am-3pm M-F ° °[facility] °[address] °[place],  [place] °336-227-7417 ° °[facility]: Residential Program (90 Days)   °[place], [place]      °336-725-8389 or 800-758-6077    ° °[facility] °[place], [place] °(Walk in Hours over the weekend or by referral) ° °[facility] °[address], [place],  [address] °([phone] ° °Crisis Mobile: [facility]:  1-877-626-1772 (for crisis response 24 hours a day) °[facility] Hotline:      1-800-256-2452 °Outpatient Psychiatry and Counseling ° °[facility]: Mobile Crisis   Management 24 hours:  1-877-626-1772 ° °[facility] sliding scale fee and walk in schedule: M-F 8am-12pm/1pm-3pm °[address]  °[place], [place] [address] °[phone] ° °[facility] °[address] °[place], [place] [address] °[phone] ° °[facility] ([facility])- new patient walk-in appointments available Monday - Friday 8am -3pm.          °[address] °[place], [place] [address] °336-676-6840 or crisis line- 336-676-6905 ° ° [facility]ensive Outpatient Therapy Program °[address] °[place], [place] [address] °336-832-9804 ° °[facility]                  °Crisis Services      °[phone]      °[address]     °[place], [place] [address]                ° °[facility]   °[facility] °[phone] °[address] °[place], [place] [address] ° ° °[facility]          °[address]  °[place], [place] [address]       °[phone] ° °[facility] °[address] °[place], [place] [address] °336-292-1510 ° °[facility]    °[address]    °[place], [place] [address]     °[phone]      ° °Parish McKinney, MD     °[address]     °[place] [address]     °336-282-1251     °  °[facility] °[address] °[place] [address] ° °[facility]     °[address]     °[place], [place]      °336-542-2076      ° °[facility] °Shamsher Ahluwalia, MD °[address] °[place], [place] [address] °[phone] ° °[facility]     °[address]     °[place], [place] [address]     °336-274-1237      ° °[facility] °[address] °[place], [place] [address] °336-854-4450 °Resources for Temporary Residential Assistance/Crisis Centers ° °DAY CENTERS °[facility] °M-F 8am-3pm   °[address] [place], [place] [address]   336-332-0824 °Services include: laundry, barbering, support groups, case management, phone  & computer access, showers, AA/NA mtgs, mental health/substance abuse nurse, job skills class, disability information, VA assistance, spiritual classes, etc.  ° °HOMELESS SHELTERS ° °[facility]     °[facility]   °[address], [place]     °[phone]       °       °[facility] (women and children)       °[address] °[place], [place] [address] °336-275-0820 °[email or web address] for application and process °Application Required ° °[facility]   °[address]    °[place] [address]     °[phone]       °             °[facility] °[address] °[place], [place] [address] °336.273.5572 °336-235-0363(schedule application appt.) °Application Required ° °[facility] (women only)    °[address]     °[place],  [address]     °336-884-1039      °  Intake starts 6pm daily °Need valid ID, SSC, & Police report °[facility] [place] °[address] °[place], [place] °336-881-5420 °Application Required ° °[facility] (men only)     °[address]      °[place], [place]     °[phone]      ° °[facility] °(Pregnant women only) °[address] °[place], [place] °336-275-0206 ° °[facility]      °[address]      °[place], [place] [address]     °[phone]      °       °[facility] °[address] °[place], [place] °336-723-1848 °90 day commitment/SA/Application process ° °[facility](men only)     °[address]     °[place], [place]     °[phone]       °Check-in at 7pm     °       °[facility] °[address] °[place], [place] [address] °336-248-6684 °Men/Women/Women and Children must be there by 7 pm ° °[facility] °[place],  °336-722-8721                ° °

## 2015-10-31 ENCOUNTER — Encounter (HOSPITAL_COMMUNITY): Payer: Self-pay

## 2015-10-31 ENCOUNTER — Emergency Department (HOSPITAL_COMMUNITY)
Admission: EM | Admit: 2015-10-31 | Discharge: 2015-10-31 | Disposition: A | Discharge status: Home / Self Care | Payer: Medicaid Other | Attending: Emergency Medicine | Admitting: Emergency Medicine

## 2015-10-31 DIAGNOSIS — Z87442 Personal history of urinary calculi: Secondary | ICD-10-CM | POA: Insufficient documentation

## 2015-10-31 DIAGNOSIS — Z880 Allergy status to penicillin: Secondary | ICD-10-CM | POA: Insufficient documentation

## 2015-10-31 DIAGNOSIS — F1310 Sedative, hypnotic or anxiolytic abuse, uncomplicated: Secondary | ICD-10-CM | POA: Diagnosis not present

## 2015-10-31 DIAGNOSIS — Z8742 Personal history of other diseases of the female genital tract: Secondary | ICD-10-CM | POA: Insufficient documentation

## 2015-10-31 DIAGNOSIS — F909 Attention-deficit hyperactivity disorder, unspecified type: Secondary | ICD-10-CM | POA: Insufficient documentation

## 2015-10-31 DIAGNOSIS — E119 Type 2 diabetes mellitus without complications: Secondary | ICD-10-CM | POA: Diagnosis not present

## 2015-10-31 DIAGNOSIS — R109 Unspecified abdominal pain: Secondary | ICD-10-CM | POA: Diagnosis not present

## 2015-10-31 DIAGNOSIS — Z3202 Encounter for pregnancy test, result negative: Secondary | ICD-10-CM | POA: Insufficient documentation

## 2015-10-31 DIAGNOSIS — F17210 Nicotine dependence, cigarettes, uncomplicated: Secondary | ICD-10-CM | POA: Diagnosis not present

## 2015-10-31 DIAGNOSIS — R1030 Lower abdominal pain, unspecified: Secondary | ICD-10-CM | POA: Diagnosis present

## 2015-10-31 DIAGNOSIS — Z8719 Personal history of other diseases of the digestive system: Secondary | ICD-10-CM | POA: Insufficient documentation

## 2015-10-31 DIAGNOSIS — Z79899 Other long term (current) drug therapy: Secondary | ICD-10-CM | POA: Insufficient documentation

## 2015-10-31 DIAGNOSIS — Z9851 Tubal ligation status: Secondary | ICD-10-CM | POA: Insufficient documentation

## 2015-10-31 DIAGNOSIS — I10 Essential (primary) hypertension: Secondary | ICD-10-CM | POA: Insufficient documentation

## 2015-10-31 DIAGNOSIS — Z8739 Personal history of other diseases of the musculoskeletal system and connective tissue: Secondary | ICD-10-CM | POA: Diagnosis not present

## 2015-10-31 DIAGNOSIS — F410 Panic disorder [episodic paroxysmal anxiety] without agoraphobia: Secondary | ICD-10-CM | POA: Insufficient documentation

## 2015-10-31 DIAGNOSIS — Z7984 Long term (current) use of oral hypoglycemic drugs: Secondary | ICD-10-CM | POA: Diagnosis not present

## 2015-10-31 DIAGNOSIS — Z7982 Long term (current) use of aspirin: Secondary | ICD-10-CM | POA: Insufficient documentation

## 2015-10-31 LAB — RAPID URINE DRUG SCREEN, HOSP PERFORMED
Amphetamines: NOT DETECTED
BARBITURATES: NOT DETECTED
BENZODIAZEPINES: POSITIVE — AB
COCAINE: NOT DETECTED
Opiates: NOT DETECTED
TETRAHYDROCANNABINOL: NOT DETECTED

## 2015-10-31 LAB — URINALYSIS, ROUTINE W REFLEX MICROSCOPIC
Bilirubin Urine: NEGATIVE
Hgb urine dipstick: NEGATIVE
Ketones, ur: NEGATIVE mg/dL
LEUKOCYTES UA: NEGATIVE
NITRITE: NEGATIVE
PH: 6 (ref 5.0–8.0)
Protein, ur: NEGATIVE mg/dL
Specific Gravity, Urine: 1.016 (ref 1.005–1.030)

## 2015-10-31 LAB — URINE MICROSCOPIC-ADD ON

## 2015-10-31 LAB — POC URINE PREG, ED: Preg Test, Ur: NEGATIVE

## 2015-10-31 MED ORDER — ACETAMINOPHEN 500 MG PO TABS
1000.0000 mg | ORAL_TABLET | Freq: Once | ORAL | Status: AC
  Administered 2015-10-31: 1000 mg via ORAL
  Filled 2015-10-31: qty 2

## 2015-10-31 NOTE — ED Notes (Signed)
Patient complains of possible right ovarian cyst x 2 days, hx of same. Nausea with same. Also wants bridge RX for klonopin and ADHD medication. No distress

## 2015-10-31 NOTE — ED Notes (Signed)
Boyfriend is in hallway, reports that pt has not vomited and is on methadaone everyday, reports she has not been on xanax since April.

## 2015-10-31 NOTE — ED Provider Notes (Signed)
CSN: [id number]     Arrival date & time 10/31/15  1008 History   First MD Initiated Contact with Patient 10/31/15 1239     Chief Complaint  Patient presents with  . Abdominal Pain      HPI Patient presents to the emergency department today lower abdominal pain over the past 2 days.  She states she has a history of ovarian cyst feels like this is similar.  She's had nausea as well.  She denies vaginal complaints.  No dysuria or urinary complaints.  She also requests a refill of her Klonopin and her Vyvanse and her gabapentin.  She states she has a scheduled appointment with her primary care doctor on Friday and needs a "bridge" until the appointment.  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    Past Medical History  Diagnosis Date  . Hypertension   . Diabetes mellitus without complication (HCC)   . Anxiety   . Panic attack   . ADHD (attention deficit hyperactivity disorder)   . Stomach ulcer   . Ovarian cyst   . Kidney stone   . H/O degenerative disc disease    Past Surgical History  Procedure Laterality Date  . Cesarean section    . Tonsillectomy    . Tubal ligation     No family history on file. Social History  Substance Use Topics  . Smoking status: Current Every Day Smoker -- 1.00 packs/day    Types: Cigarettes  . Smokeless tobacco: None  . Alcohol Use: No   OB History    No data available     Review of Systems  All other systems reviewed and are negative.     Allergies  Ketorolac; Nsaids; Penicillins; Tramadol; and Zofran  Home Medications   Prior to Admission medications   Medication Sig Start Date End Date Taking? Authorizing Provider  acetaminophen (TYLENOL) 500 MG tablet Take 500 mg by mouth every 6 (six) hours as needed for mild pain.   Yes Historical Provider, MD  albuterol (PROVENTIL HFA;VENTOLIN HFA) 108 (90 BASE) MCG/ACT inhaler Inhale 1 puff into the lungs every 6 (six) hours as needed for wheezing or shortness of breath.   Yes  Historical Provider, MD  ALPRAZolam [name]) 1 MG tablet Take 1 mg by mouth 3 (three) times daily.   Yes Historical Provider, MD  amLODipine (NORVASC) 10 MG tablet Take 10 mg by mouth daily.   Yes Historical Provider, MD  aspirin 81 MG chewable tablet Chew 81 mg by mouth daily.   Yes Historical Provider, MD  lisdexamfetamine (VYVANSE) 50 MG capsule Take 50 mg by mouth daily.   Yes Historical Provider, MD  lisinopril (PRINIVIL,ZESTRIL) 10 MG tablet Take 10 mg by mouth daily.   Yes Historical Provider, MD  metFORMIN (GLUCOPHAGE) 500 MG tablet Take 1 tablet (500 mg total) by mouth 2 (two) times daily with a meal. Patient taking differently: Take 500 mg by mouth daily with breakfast.  08/04/15  Yes [name], MD   BP 129/97 mmHg  Pulse 107  Temp(Src) 97.7 F (36.5 C) (Oral)  Resp 18  SpO2 98% Physical Exam  Constitutional: She is oriented to person, place, and time. She appears well-developed and well-nourished. No distress.  HENT:  Head: Normocephalic and atraumatic.  Eyes: EOM are normal.  Neck: Normal range of motion.  Cardiovascular: Normal rate, regular rhythm and normal heart sounds.   Pulmonary/Chest: Effort normal and breath sounds normal.  Abdominal: Soft. She exhibits no distension. There is no  tenderness.  Musculoskeletal: Normal range of motion.  Neurological: She is alert and oriented to person, place, and time.  Skin: Skin is warm and dry.  Psychiatric: She has a normal mood and affect. Judgment normal.  Nursing note and vitals reviewed.   ED Course  Procedures (including critical care time) Labs Review Labs Reviewed  URINALYSIS, ROUTINE W REFLEX MICROSCOPIC (NOT AT [facility]) - Abnormal; Notable for the following:    APPearance CLOUDY (*)    Glucose, UA >1000 (*)    All other components within normal limits  URINE RAPID DRUG SCREEN, HOSP PERFORMED - Abnormal; Notable for the following:    Benzodiazepines POSITIVE (*)    All other components within normal limits  URINE  MICROSCOPIC-ADD ON - Abnormal; Notable for the following:    Squamous Epithelial / LPF 6-30 (*)    Bacteria, UA MANY (*)    All other components within normal limits  POC URINE PREG, ED  CBG MONITORING, ED    Imaging Review No results found. I have personally reviewed and evaluated these images and lab results as part of my medical decision-making.   EKG Interpretation None      MDM   Final diagnoses:  Abdominal pain, unspecified abdominal location    Patient noted to have glucose in her urine.  Requested blood sugar but the patient is refusing at this time.  She is currently unhappy with the care provided the emergency department and does not believe we've been aggressive enough in treating her pain.  When I went back to talk to the patient in regards to her blood sugar in attempt to obtain this she had left the emergency department.  Urinalysis without abnormalities and does not show signs of infection at this time.  Urine culture sent.  Urine pregnancy test is negative.  She is nontender on my abdominal examination I do not think this is a presentation of appendicitis.  Hopefully she will return to the emergency department so we can further evaluate her blood sugar.       Azalia BilisKevin Roshelle Traub, MD 10/31/15 1520

## 2015-10-31 NOTE — ED Notes (Signed)
Patient refused POCT CBG testing stating "I'm hurting and the doctor Patria Mane(Campos, MD) won't give me anything or that nurse there (pointing at [place], [place]).  Don't even worry about that, I'm leaving cause they ain't doing shit. And where is my man at?"; Juli, RN and [place], MD informed

## 2015-10-31 NOTE — ED Notes (Signed)
Pt snoring in lobby, awakened with gentle stimuli, boyfriend reports pt took her methadone this morning.  Pt reports multiple complaints of RLQ abdominal pain, vomiting; pt reports being out of all her medication x 1 week and is requesting refills.

## 2015-11-07 ENCOUNTER — Encounter: Payer: Self-pay | Admitting: *Deleted

## 2015-11-07 ENCOUNTER — Emergency Department
Admission: EM | Admit: 2015-11-07 | Discharge: 2015-11-07 | Disposition: A | Discharge status: Home / Self Care | Payer: Medicaid Other | Source: Home / Self Care | Attending: Family Medicine | Admitting: Family Medicine

## 2015-11-07 ENCOUNTER — Encounter: Payer: Self-pay | Admitting: Family Medicine

## 2015-11-07 ENCOUNTER — Other Ambulatory Visit: Payer: Self-pay | Admitting: Family Medicine

## 2015-11-07 ENCOUNTER — Ambulatory Visit (INDEPENDENT_AMBULATORY_CARE_PROVIDER_SITE_OTHER): Payer: Self-pay | Admitting: Family Medicine

## 2015-11-07 VITALS — BP 145/105 | HR 103 | Ht 65.0 in | Wt 217.0 lb

## 2015-11-07 DIAGNOSIS — F13930 Sedative, hypnotic or anxiolytic use, unspecified with withdrawal, uncomplicated: Secondary | ICD-10-CM

## 2015-11-07 DIAGNOSIS — F32A Depression, unspecified: Secondary | ICD-10-CM

## 2015-11-07 DIAGNOSIS — F419 Anxiety disorder, unspecified: Principal | ICD-10-CM

## 2015-11-07 DIAGNOSIS — Z765 Malingerer [conscious simulation]: Secondary | ICD-10-CM

## 2015-11-07 DIAGNOSIS — R600 Localized edema: Secondary | ICD-10-CM | POA: Diagnosis not present

## 2015-11-07 DIAGNOSIS — E119 Type 2 diabetes mellitus without complications: Secondary | ICD-10-CM

## 2015-11-07 DIAGNOSIS — F4321 Adjustment disorder with depressed mood: Secondary | ICD-10-CM

## 2015-11-07 DIAGNOSIS — F319 Bipolar disorder, unspecified: Secondary | ICD-10-CM

## 2015-11-07 DIAGNOSIS — F418 Other specified anxiety disorders: Secondary | ICD-10-CM

## 2015-11-07 DIAGNOSIS — F13239 Sedative, hypnotic or anxiolytic dependence with withdrawal, unspecified: Secondary | ICD-10-CM | POA: Insufficient documentation

## 2015-11-07 DIAGNOSIS — F13939 Sedative, hypnotic or anxiolytic use, unspecified with withdrawal, unspecified: Secondary | ICD-10-CM | POA: Insufficient documentation

## 2015-11-07 DIAGNOSIS — F1910 Other psychoactive substance abuse, uncomplicated: Secondary | ICD-10-CM

## 2015-11-07 DIAGNOSIS — F13230 Sedative, hypnotic or anxiolytic dependence with withdrawal, uncomplicated: Secondary | ICD-10-CM

## 2015-11-07 DIAGNOSIS — F329 Major depressive disorder, single episode, unspecified: Secondary | ICD-10-CM

## 2015-11-07 DIAGNOSIS — M7989 Other specified soft tissue disorders: Secondary | ICD-10-CM

## 2015-11-07 LAB — POCT GLYCOSYLATED HEMOGLOBIN (HGB A1C): Hemoglobin A1C: 10.6

## 2015-11-07 LAB — POCT FASTING CBG KUC MANUAL ENTRY: POCT Glucose (KUC): 183 mg/dL — AB (ref 70–99)

## 2015-11-07 MED ORDER — GABAPENTIN 600 MG PO TABS
600.0000 mg | ORAL_TABLET | Freq: Three times a day (TID) | ORAL | Status: AC
Start: 2015-11-07 — End: ?

## 2015-11-07 MED ORDER — DIAZEPAM 5 MG PO TABS: ORAL_TABLET | ORAL | Status: AC

## 2015-11-07 MED ORDER — ACETAMINOPHEN 325 MG PO TABS
650.0000 mg | ORAL_TABLET | Freq: Once | ORAL | Status: AC
  Administered 2015-11-07: 650 mg via ORAL

## 2015-11-07 MED ORDER — METFORMIN HCL 500 MG PO TABS: 500.0000 mg | ORAL_TABLET | Freq: Every day | ORAL | Status: AC

## 2015-11-07 NOTE — Discharge Instructions (Signed)
Edema Edema is an abnormal buildup of fluids. It is more common in your legs and thighs. Painless swelling of the feet and ankles is more likely as a person ages. It also is common in looser skin, like around your eyes. HOME CARE   Keep the affected body part above the level of the heart while lying down.  Do not sit still or stand for a long time.  Do not put anything right under your knees when you lie down.  Do not wear tight clothes on your upper legs.  Exercise your legs to help the puffiness (swelling) go down.  Wear elastic bandages or support stockings as told by your doctor.  A low-salt diet may help lessen the puffiness.  Only take medicine as told by your doctor. GET HELP IF:  Treatment is not working.  You have heart, liver, or kidney disease and notice that your skin looks puffy or shiny.  You have puffiness in your legs that does not get better when you raise your legs.  You have sudden weight gain for no reason. GET HELP RIGHT AWAY IF:   You have shortness of breath or chest pain.  You cannot breathe when you lie down.  You have pain, redness, or warmth in the areas that are puffy.  You have heart, liver, or kidney disease and get edema all of a sudden.  You have a fever and your symptoms get worse all of a sudden. MAKE SURE YOU:   Understand these instructions. Panic Attacks Panic attacks are sudden, short-livedsurges of severe anxiety, fear, or discomfort. They may occur for no reason when you are relaxed, when you are anxious, or when you are sleeping. Panic attacks may occur for a number of reasons:  Healthy people occasionally have panic attacks in extreme, life-threatening situations, such as war or natural disasters. Normal anxiety is a protective mechanism of the body that helps [place] react to danger (fight or flight response). Panic attacks are often seen with anxiety disorders, such as panic disorder, social anxiety disorder, generalized anxiety  disorder, and phobias. Anxiety disorders cause excessive or uncontrollable anxiety. They may interfere with your relationships or other life activities. Panic attacks are sometimes seen with other mental illnesses, such as depression and posttraumatic stress disorder. Certain medical conditions, prescription medicines, and drugs of abuse can cause panic attacks. SYMPTOMS  Panic attacks start suddenly, peak within 20 minutes, and are accompanied by four or more of the following symptoms: Pounding heart or fast heart rate (palpitations). Sweating. Trembling or shaking. Shortness of breath or feeling smothered. Feeling choked. Chest pain or discomfort. Nausea or strange feeling in your stomach. Dizziness, light-headedness, or feeling like you will faint. Chills or hot flushes. Numbness or tingling in your lips or hands and feet. Feeling that things are not real or feeling that you are not yourself. Fear of losing control or going crazy. Fear of dying. Some of these symptoms can mimic serious medical conditions. For example, you may think you are having a heart attack. Although panic attacks can be very scary, they are not life threatening. DIAGNOSIS  Panic attacks are diagnosed through an assessment by your health care provider. Your health care provider will ask questions about your symptoms, such as where and when they occurred. Your health care provider will also ask about your medical history and use of alcohol and drugs, including prescription medicines. Your health care provider may order blood tests or other studies to rule out a serious medical condition.  Your health care provider may refer you to a mental health professional for further evaluation. TREATMENT  Most healthy people who have one or two panic attacks in an extreme, life-threatening situation will not require treatment. The treatment for panic attacks associated with anxiety disorders or other mental illness typically involves  counseling with a mental health professional, medicine, or a combination of both. Your health care provider will help determine what treatment is best for you. Panic attacks due to physical illness usually go away with treatment of the illness. If prescription medicine is causing panic attacks, talk with your health care provider about stopping the medicine, decreasing the dose, or substituting another medicine. Panic attacks due to alcohol or drug abuse go away with abstinence. Some adults need professional help in order to stop drinking or using drugs. HOME CARE INSTRUCTIONS  Take all medicines as directed by your health care provider.  Schedule and attend follow-up visits as directed by your health care provider. It is important to keep all your appointments. SEEK MEDICAL CARE IF: You are not able to take your medicines as prescribed. Your symptoms do not improve or get worse. SEEK IMMEDIATE MEDICAL CARE IF:  You experience panic attack symptoms that are different than your usual symptoms. You have serious thoughts about hurting yourself or others. You are taking medicine for panic attacks and have a serious side effect. MAKE SURE YOU: Understand these instructions. Will watch your condition. Will get help right away if you are not doing well or get worse.   This information is not intended to replace advice given to you by your health care provider. Make sure you discuss any questions you have with your health care provider.   Document Released: 05/13/2005 Document Revised: 05/18/2013 Document Reviewed: 12/25/2012 Elsevier Interactive Patient Education [employer]2016 Elsevier Inc.   Will watch your condition.  Will get help right away if you are not doing well or get worse.   This information is not intended to replace advice given to you by your health care provider. Make sure you discuss any questions you have with your health care provider.   Document Released: 10/30/2007 Document  Revised: 05/18/2013 Document Reviewed: 03/05/2013 Elsevier Interactive Patient Education 2016 Elsevier Inc.  RESOURCE GUIDE  Dental Problems  Patients with Medicaid: [facility]                     [facility] [id number]                                           [phone] Phone:  [phone]                                                  Phone:  [phone]  If unable to pay or uninsured, contact:  [facility] or [facility] to become qualified for the adult dental clinic.  Chronic Pain Problems Contact [facility]  [phone] Patients need to be referred by their primary care doctor.  Insufficient Money for Medicine Contact [employer]:  call "211" or [facility] [phone].  No Primary Care Doctor Call [facility]  [phone] Other agencies that provide inexpensive medical care  [facility]  [phone]    [facility]  [phone]    [facility]  [phone]    [facility]  [phone]    [facility]  [phone]    [facility]  [phone]  Psychological Services [facility]  [phone] [facility]  [phone] [facility]   [phone] (emergency services [phone])  Substance Abuse Resources [facility]  [phone] [facility] [phone] [place] 6 [phone] Floydene Flock [phone] Residential & Outpatient Substance Abuse Program  2366153415  Abuse/Neglect [facility] Child Abuse Hotline [phone] [facility] Child Abuse Hotline [phone] (After Hours)  Emergency Shelter [facility] [phone]  Maternity Homes [facility] at the [place] [facility] [phone] [facility] [phone]  MRSA Hotline #:   (902)713-0620    [facility] Resources  [facility]     [employer]                           [facility] [address] [place]                       [address]      [address]  Blondell Reveal Phone:  [phone]                                   Phone:  [phone]                 Phone:  [phone]  [facility] Phone:  [phone]  [facility] Child Abuse Hotline [phone] [phone] (After Hours)

## 2015-11-07 NOTE — Patient Instructions (Signed)
Thank you for coming in today. We will refer to psychiatry.  We will STOP xanax and medicines like it.  I will prescribe a tapering dose of Valium.  Take 1 pill 3x daily for 3 days.  Then take 1 pill 2x daily for 3 days then take 1 pill daily for 3 days.  Get labs and xray today.  Return tomorrow.  Call or go to the emergency room if you get worse, have trouble breathing, have chest pains, or palpitations.   Benzodiazepine Withdrawal  Benzodiazepines are a group of drugs that are prescribed for both short-term and long-term treatment of a variety of medical conditions. For some of these conditions, such as seizures and sudden and severe muscle spasms, they are used only for a few hours or a few days. For other conditions, such as anxiety, sleep problems, or frequent muscle spasms or to help prevent seizures, they are used for an extended period, usually weeks or months. Benzodiazepines work by changing the way your brain functions. Normally, chemicals in your brain called neurotransmitters send messages between your brain cells. The neurotransmitter that benzodiazepines affect is called gamma-aminobutyric acid (GABA). GABA sends out messages that have a calming effect on many of the functions of your brain. Benzodiazepines make these messages stronger and increase this calming effect. Short-term use of benzodiazepines usually does not cause problems when you stop taking the drugs. However, if you take benzodiazepines for a long time, your body can adjust to the drug and require more of it to produce the same effect (drug tolerance). Eventually, you can develop physical dependence on benzodiazepines, which is when you experience negative effects if your dosage of benzodiazepines is reduced or stopped too quickly. These negative effects are called symptoms of withdrawal. SYMPTOMS Symptoms of withdrawal may begin anytime within the first 10 days after you stop taking the benzodiazepine. They can last  from several weeks up to a few months but usually are the worst between the first 10 to 14 days.  The actual symptoms also vary, depending on the type of benzodiazepine you take. Possible symptoms include:  Anxiety.  Excitability.  Irritability.  Depression.  Mood swings.  Trouble sleeping.  Confusion.  Uncontrollable shaking (tremors).  Muscle weakness.  Seizures. DIAGNOSIS To diagnose benzodiazepine withdrawal, your caregiver will examine you for certain signs, such as:  Rapid heartbeat.  Rapid breathing.  Tremors.  High blood pressure.  Fever.  Mood changes. Your caregiver also may ask the following questions about your use of benzodiazepines:  What type of benzodiazepine did you take?  How much did you take each day?  How long did you take the drug?  When was the last time you took the drug?  Do you take any other drugs?  Have you had alcohol recently?  Have you had a seizure recently?  Have you lost consciousness recently?  Have you had trouble remembering recent events?  Have you had a recent increase in anxiety, irritability, or trouble sleeping? A drug test also may be administered. TREATMENT The treatment for benzodiazepine withdrawal can vary, depending on the type and severity of your symptoms, what type of benzodiazepine you have been taking, and how long you have been taking the benzodiazepine. Sometimes it is necessary for you to be treated in a hospital, especially if you are at risk of seizures.  Often, treatment includes a prescription for a long-acting benzodiazepine, the dosage of which is reduced slowly over a long period. This period could be several weeks or  months. Eventually, your dosage will be reduced to a point that you can stop taking the drug, without experiencing withdrawal symptoms. This is called tapered withdrawal. Occasionally, minor symptoms of withdrawal continue for a few days or weeks after you have completed a tapered  withdrawal. SEEK IMMEDIATE MEDICAL CARE IF:  You have a seizure.  You develop a craving for drugs or alcohol.  You begin to experience symptoms of withdrawal during your tapered withdrawal.  You become very confused.  You lose consciousness.  You have trouble breathing.  You think about hurting yourself or someone else.   This information is not intended to replace advice given to you by your health care provider. Make sure you discuss any questions you have with your health care provider.   Document Released: 05/02/2011 Document Revised: 06/03/2014 Document Reviewed: 11/02/2014 Elsevier Interactive Patient Education [employer]2016 Elsevier Inc.

## 2015-11-07 NOTE — ED Notes (Signed)
Pt c/o bilateral leg and feet swelling with pain. Denis SOB. Reports her pnuemonia like cough has come back.  Also c/o being very upset and not sleeping because her aunt passed away. Reports she is out of her xanax and vyvanse and "needs something for my nerves".

## 2015-11-07 NOTE — Progress Notes (Signed)
Haley Howell is a 30 y.o. female who [facility] [place] Primary Care Sports Medicine today for anxiety grief and leg swelling.  Patient has a complex past medical history revolving around mental health illness drug dependency and abuse chronic pain and type 2 diabetes.  Anxiety and grief: Patient notes that her aunt died yesterday. This is quite a shock and has caused significant anxiety and grief. She notes that she's been unable to get to her psychiatrist due to cost issues. She's run out of her Xanax about 2 days ago and her Vyvanse a few weeks ago. She notes that she has become much more anxious as result as well.   Leg swelling: Patient has bilateral lower extremity swelling. This is been present over the last several days and is mildly painful. She denies significant shortness of breath cough or congestion fevers or chills. She denies a personal history of kidney failure or pulmonary edema.  Diabetes: Patient notes history of diabetes. She typically takes metformin 500 mg daily.  Past Medical History  Diagnosis Date  . Hypertension   . Diabetes mellitus without complication (HCC)   . Anxiety   . Panic attack   . ADHD (attention deficit hyperactivity disorder)   . Stomach ulcer   . Ovarian cyst   . Kidney stone   . H/O degenerative disc disease    Past Surgical History  Procedure Laterality Date  . Cesarean section    . Tonsillectomy    . Tubal ligation     Social History  Substance Use Topics  . Smoking status: Current Every Day Smoker -- 1.00 packs/day    Types: Cigarettes  . Smokeless tobacco: Not on file  . Alcohol Use: No   family history includes Depression in her mother; Diabetes in her father and mother; Hyperlipidemia in her father; Hypertension in her father and mother.  ROS as above: No headache, visual changes, nausea, vomiting, diarrhea, constipation,  dizziness, abdominal pain, skin rash, fevers, chills, night sweats, weight loss, swollen lymph nodes, muscle aches, chest pain, shortness of breath, mood changes, visual or auditory hallucinations.   Medications: Current Outpatient Prescriptions  Medication Sig Dispense Refill  . albuterol (PROAIR HFA) 108 (90 Base) MCG/ACT inhaler Inhale 1 puff into the lungs.    . hydrochlorothiazide (HYDRODIURIL) 25 MG tablet Take 25 mg by mouth daily.    Marland Kitchen. albuterol (PROVENTIL HFA;VENTOLIN HFA) 108 (90 BASE) MCG/ACT inhaler Inhale 1 puff into the lungs every 6 (six) hours as needed for wheezing or shortness of breath.    Marland Kitchen. aspirin 81 MG EC tablet Take by mouth.    . diazepam (VALIUM) 5 MG tablet Take 1 pill by mouth 3x daily for 3 days then Take 1 pill by mouth 2x daily for 3 days then Take 1 pill by moth daily for 3 days. 18 tablet 0  . gabapentin (NEURONTIN) 600 MG tablet Take 1 tablet (600 mg total) by mouth 3 (three) times daily. 90 tablet 1  . lisdexamfetamine (VYVANSE) 50 MG capsule Take 50 mg by mouth daily.    Marland Kitchen. lisinopril (PRINIVIL,ZESTRIL) 10 MG tablet Take 10 mg by mouth daily.    . metFORMIN (GLUCOPHAGE) 500 MG tablet Take 1 tablet (500 mg total) by mouth daily with breakfast. 60 tablet 0  . promethazine (PHENERGAN) 25 MG tablet Take 25 mg by mouth every 6 (six) hours as needed. for nausea  0   No current facility-administered medications for this visit.  Allergies  Allergen Reactions  . Ibuprofen     Other reaction(s): Cramps (ALLERGY/intolerance)  . Ketorolac Hives  . Nsaids     Stomach ulcer  . Penicillins     rash  . Tolmetin     Stomach ulcer  . Tramadol Hives  . Zofran [Ondansetron Hcl]     Causes headache     Exam:  BP 145/105 mmHg  Pulse 103  Ht  (1.651 m)  Wt 217 lb (98.431 kg)  BMI 36.11 kg/m2  LMP 10/31/2015 Gen: Tearful crying woman HEENT: EOMI,  MMM Lungs: Normal work of breathing. CTABL Heart: RRR no MRG Abd: NABS, Soft. Nondistended,  Nontender Exts: 1+ pitting lower extremity edema to calves bilaterally. Calf diameters equal bilaterally Normal calf motion and leg motion. No palpable cords. Psych: Alert and oriented tearful affect normal speech. Normal thought process. No SI or HI expressed. Neuro: Alert no tremor normal gait  Results for orders placed or performed in visit on 11/07/15 (from the past 24 hour(s))  POCT HgB A1C     Status: None   Collection Time: 11/07/15  2:52 PM  Result Value Ref Range   Hemoglobin A1C 10.6    No results found.    Assessment and Plan: 30 y.o. female with  1) leg swelling: Unclear etiology. Plan to obtain CBC CMP chest x-ray and return tomorrow. If creatinine is reasonable start Lasix. Continue current regimen.  2) Benzodiazepine withdrawal. Limited Valium taper. Refer to psychiatry. Obtain urine drug screen.  3) diabetes: Uncontrolled. Check creatinine. Increase metformin at the next visit.  Discussed warning signs or symptoms. Please see discharge instructions. Patient expresses understanding.

## 2015-11-07 NOTE — ED Provider Notes (Signed)
CSN: [id number]     Arrival date & time 11/07/15  1151 History   First MD Initiated Contact with Patient 11/07/15 1201     Chief Complaint  Patient presents with  . Leg Swelling  . Anxiety   (Consider location/radiation/quality/duration/timing/severity/associated sxs/prior Treatment) HPI Haley Howell is a 30 y.o. female presenting to UC with multiple complaints including bilateral leg and feet swelling for 2-3 days,  denies SOB or chest pain. Per triage note, pt c/o cough, when asked about SOB, pt said "no" then requested to discuss her "nerves"  Pt did not mention cough on exam.  Pt requesting medication refill of Xanax and Vyvanse as she notes she has not slept since yesterday due to her aunt passing away. States she cannot afford the $200 to see her psychiatrist.  Denies SI/HI.  Pt states she has not had a PCP for several months because her prior PCP was "rude." she still is taking her prescribed metformin, lisinopril, and stated she was taking HCTZ with 1 refill left.     Per Care Everywhere, pt was seen at [facility] earlier today with same complaints including leg swelling and cough. They offered CXR to r/o pneumonia or pulmonary edema, however, according to records, pt declined CXR after learning they would not be prescribing narcotic medications. Pt stated she would be going to the ED.   Pt did not mention today's earlier visit to [facility].    Past Medical History  Diagnosis Date  . Hypertension   . Diabetes mellitus without complication (HCC)   . Anxiety   . Panic attack   . ADHD (attention deficit hyperactivity disorder)   . Stomach ulcer   . Ovarian cyst   . Kidney stone   . H/O degenerative disc disease    Past Surgical History  Procedure Laterality Date  . Cesarean section    . Tonsillectomy    . Tubal ligation     History reviewed. No pertinent family history. Social History  Substance Use Topics  . Smoking status: Current Every Day  Smoker -- 1.00 packs/day    Types: Cigarettes  . Smokeless tobacco: None  . Alcohol Use: No   OB History    No data available     Review of Systems  Constitutional: Negative for fever and chills.  Respiratory: Positive for cough. Negative for shortness of breath and wheezing.   Cardiovascular: Positive for leg swelling ( bilateral). Negative for chest pain and palpitations.  Psychiatric/Behavioral: Positive for sleep disturbance. Negative for suicidal ideas and self-injury. The patient is nervous/anxious.     Allergies  Ketorolac; Nsaids; Penicillins; Tramadol; and Zofran  Home Medications   Prior to Admission medications   Medication Sig Start Date End Date Taking? Authorizing Provider  ALPRAZolam [name]) 1 MG tablet Take 1 mg by mouth 3 (three) times daily.   Yes Historical Provider, MD  amLODipine (NORVASC) 10 MG tablet Take 10 mg by mouth daily.   Yes Historical Provider, MD  aspirin 81 MG chewable tablet Chew 81 mg by mouth daily.   Yes Historical Provider, MD  lisdexamfetamine (VYVANSE) 50 MG capsule Take 50 mg by mouth daily.   Yes Historical Provider, MD  lisinopril (PRINIVIL,ZESTRIL) 10 MG tablet Take 10 mg by mouth daily.   Yes Historical Provider, MD  metFORMIN (GLUCOPHAGE) 500 MG tablet Take 1 tablet (500 mg total) by mouth 2 (two) times daily with a meal. Patient taking differently: Take 500 mg by mouth daily with breakfast.  08/04/15  Yes Linwood DibblesJon Knapp, MD  albuterol (PROVENTIL HFA;VENTOLIN HFA) 108 (90 BASE) MCG/ACT inhaler Inhale 1 puff into the lungs every 6 (six) hours as needed for wheezing or shortness of breath.    Historical Provider, MD   Meds Ordered and Administered this Visit   Medications  acetaminophen (TYLENOL) tablet 650 mg (650 mg Oral Given 11/07/15 1230)    BP 147/90 mmHg  Pulse 99  Temp(Src) 98.3 F (36.8 C) (Oral)  Resp 18  Wt 214 lb (97.07 kg)  SpO2 95%  LMP 10/31/2015 No data found.   Physical Exam  Constitutional: She appears  well-developed and well-nourished. She appears distressed.  Pt sitting on exam table, tearful  HENT:  Head: Normocephalic and atraumatic.  Eyes: Conjunctivae are normal. No scleral icterus.  Neck: Normal range of motion.  Cardiovascular: Normal rate, regular rhythm and normal heart sounds.   Pulmonary/Chest: Effort normal and breath sounds normal. No respiratory distress. She has no wheezes. She has no rales. She exhibits no tenderness.  Musculoskeletal: Normal range of motion. She exhibits edema. She exhibits no tenderness.  1+ bilateral lower leg edema. Muscle compartments are soft, non-tender. Full ROM knees and ankles.  Neurological: She is alert.  Skin: Skin is warm and dry. She is not diaphoretic. No erythema.  Bilateral lower legs: no erythema or ecchymosis.  Psychiatric: Her speech is normal. Her mood appears anxious ( tearful). Her affect is angry. She is agitated. She exhibits a depressed mood. She expresses no homicidal and no suicidal ideation.  Nursing note and vitals reviewed.   ED Course  Procedures (including critical care time)  Labs Review Labs Reviewed  POCT FASTING CBG [facility] MANUAL ENTRY - Abnormal; Notable for the following:    POCT Glucose ([facility]) 183 (*)    All other components within normal limits    Imaging Review No results found.    MDM   1. Anxiety and depression   2. Grief reaction   3. Bilateral leg edema   4. Drug-seeking behavior    Due to pt reporting being on HCZT and lisinopril, will hold off on adding Lasix for pedal edema due to lack of f/u.  Encouraged elevation and OTC compression socks.    CBG checked per pt request as she believes she should be on insulin. CBG- 183.  Encouraged to keep monitoring.   Pt tearful/upset, no controlled substances prescribed in UC today.  Encouraged her to establish care with a PCP and/or use resource guide to follow up with Behavioral Health.  [facility]  as [facility] do take [facility]-ins.   Pt advised [facility].  Junius Finnerrin O'Malley, PA-C 11/07/15 1353

## 2015-11-08 ENCOUNTER — Ambulatory Visit (INDEPENDENT_AMBULATORY_CARE_PROVIDER_SITE_OTHER): Payer: Medicaid Other | Admitting: Family Medicine

## 2015-11-08 DIAGNOSIS — Z5329 Procedure and treatment not carried out because of patient's decision for other reasons: Secondary | ICD-10-CM

## 2015-11-08 LAB — COMPREHENSIVE METABOLIC PANEL
ALK PHOS: 104 U/L (ref 33–115)
ALT: 29 U/L (ref 6–29)
AST: 23 U/L (ref 10–30)
Albumin: 3.9 g/dL (ref 3.6–5.1)
BUN: 8 mg/dL (ref 7–25)
CHLORIDE: 102 mmol/L (ref 98–110)
CO2: 24 mmol/L (ref 20–31)
Calcium: 9 mg/dL (ref 8.6–10.2)
Creat: 0.64 mg/dL (ref 0.50–1.10)
GLUCOSE: 144 mg/dL — AB (ref 65–99)
POTASSIUM: 3.9 mmol/L (ref 3.5–5.3)
Sodium: 135 mmol/L (ref 135–146)
Total Bilirubin: 0.3 mg/dL (ref 0.2–1.2)
Total Protein: 6.5 g/dL (ref 6.1–8.1)

## 2015-11-08 LAB — CBC
HCT: 38.5 % (ref 35.0–45.0)
Hemoglobin: 12.9 g/dL (ref 11.7–15.5)
MCH: 28.7 pg (ref 27.0–33.0)
MCHC: 33.5 g/dL (ref 32.0–36.0)
MCV: 85.6 fL (ref 80.0–100.0)
MPV: 11.9 fL (ref 7.5–12.5)
PLATELETS: 229 10*3/uL (ref 140–400)
RBC: 4.5 MIL/uL (ref 3.80–5.10)
RDW: 15.3 % — AB (ref 11.0–15.0)
WBC: 6.7 10*3/uL (ref 3.8–10.8)

## 2015-11-08 LAB — TSH: TSH: 1.64 mIU/L

## 2015-11-08 NOTE — Progress Notes (Signed)
No show. F/u soon 

## 2015-11-09 ENCOUNTER — Ambulatory Visit (INDEPENDENT_AMBULATORY_CARE_PROVIDER_SITE_OTHER): Payer: Self-pay | Admitting: Family Medicine

## 2015-11-09 ENCOUNTER — Encounter: Payer: Self-pay | Admitting: Family Medicine

## 2015-11-09 VITALS — BP 139/90 | HR 99 | Wt 216.0 lb

## 2015-11-09 DIAGNOSIS — IMO0001 Reserved for inherently not codable concepts without codable children: Secondary | ICD-10-CM

## 2015-11-09 DIAGNOSIS — G8929 Other chronic pain: Secondary | ICD-10-CM

## 2015-11-09 DIAGNOSIS — M7989 Other specified soft tissue disorders: Secondary | ICD-10-CM

## 2015-11-09 DIAGNOSIS — F1320 Sedative, hypnotic or anxiolytic dependence, uncomplicated: Secondary | ICD-10-CM

## 2015-11-09 DIAGNOSIS — E1165 Type 2 diabetes mellitus with hyperglycemia: Secondary | ICD-10-CM

## 2015-11-09 DIAGNOSIS — G4700 Insomnia, unspecified: Secondary | ICD-10-CM

## 2015-11-09 DIAGNOSIS — F1910 Other psychoactive substance abuse, uncomplicated: Secondary | ICD-10-CM

## 2015-11-09 DIAGNOSIS — F319 Bipolar disorder, unspecified: Secondary | ICD-10-CM

## 2015-11-09 MED ORDER — CLONIDINE HCL 0.1 MG PO TABS: 0.1000 mg | ORAL_TABLET | Freq: Three times a day (TID) | ORAL | Status: DC

## 2015-11-09 MED ORDER — QUETIAPINE FUMARATE 300 MG PO TABS: 300.0000 mg | ORAL_TABLET | Freq: Every day | ORAL | Status: AC

## 2015-11-09 MED ORDER — PROMETHAZINE HCL 25 MG PO TABS: 25.0000 mg | ORAL_TABLET | Freq: Three times a day (TID) | ORAL | Status: AC | PRN

## 2015-11-09 MED ORDER — FUROSEMIDE 20 MG PO TABS: 20.0000 mg | ORAL_TABLET | Freq: Every day | ORAL | Status: DC

## 2015-11-09 MED ORDER — GLIMEPIRIDE 2 MG PO TABS: 2.0000 mg | ORAL_TABLET | Freq: Every day | ORAL | Status: AC

## 2015-11-09 NOTE — Patient Instructions (Addendum)
Thank you for coming in today. Take seroquel at night.  Use amaryl in addition to metformin for diabetes.  Take lasix for leg swelling.  Use clonidine for methadone withdrawal.  Return in 1 week.   Glimepiride tablets What is this medicine? GLIMEPIRIDE (GLYE me pye ride) helps to treat type 2 diabetes. Treatment is combined with diet and exercise. This medicine helps your body use insulin better. This medicine may be used for other purposes; ask your health care provider or pharmacist if you have questions. What should I tell my health care provider before I take this medicine? They need to know if you have any of these conditions: -diabetic ketoacidosis -glucose-6-phosphate dehydrogenase deficiency -heart disease -kidney disease -liver disease -severe infection or injury -thyroid disease -an unusual or allergic reaction to glimepiride, sulfa drugs, other medicines, foods, dyes, or preservatives -pregnancy or recent attempts to get pregnant -breast-feeding How should I use this medicine? Take this medicine by mouth. Swallow with a drink of water. Follow the directions on the prescription label. Take your dose at the same time each day, with breakfast or your first large meal. Do not take more often than directed. Talk to your pediatrician regarding the use of this medicine in children. Special care may be needed. Elderly patients over 736 years old can have a stronger reaction and need a smaller dose. Overdosage: If you think you have taken too much of this medicine contact a poison control center or emergency room at once. NOTE: This medicine is only for you. Do not share this medicine with others. What if I miss a dose? If you miss a dose, take it as soon as you can. If it is almost time for your next dose, take only that dose. Do not take double or extra doses. What may interact with this medicine? -bosentan -chloramphenicol -cisapride -clarithromycin -medicines for fungal or  yeast infections -metoclopramide -probenecid -warfarin Many medications may cause an increase or decrease in blood sugar, these include: -alcohol containing beverages -aspirin and aspirin-like drugs -chloramphenicol -chromium -female hormones, like estrogens or progestins and birth control pills -fluoxetine -heart medicines like disopyramide -isoniazid -female hormones or anabolic steroids -medicines called MAO Inhibitors like Nardil, Parnate, Marplan, Eldepryl -medicines for allergies, asthma, cold, or cough -medicines for mental problems -medicines for weight loss -niacin -NSAIDs, medicines for pain and inflammation, like ibuprofen or naproxen -pentamidine -phenytoin -probenecid -quinolone antibiotics like ciprofloxacin, levofloxacin, ofloxacin -some herbal dietary supplements -steroid medicines like prednisone or cortisone -thyroid medicine -water pills or diuretics This list may not describe all possible interactions. Give your health care provider a list of all the medicines, herbs, non-prescription drugs, or dietary supplements you use. Also tell them if you smoke, drink alcohol, or use illegal drugs. Some items may interact with your medicine. What should I watch for while using this medicine? Visit your doctor or health care professional for regular checks on your progress. A test called the HbA1C (A1C) will be monitored. This is a simple blood test. It measures your blood sugar control over the last 2 to 3 months. You will receive this test every 3 to 6 months. Learn how to check your blood sugar. Learn the symptoms of low and high blood sugar and how to manage them. Always carry a quick-source of sugar with you in case you have symptoms of low blood sugar. Examples include hard sugar candy or glucose tablets. Make sure others know that you can choke if you eat or drink when you develop serious symptoms  of low blood sugar, such as seizures or unconsciousness. They must get  medical help at once. Tell your doctor or health care professional if you have high blood sugar. You might need to change the dose of your medicine. If you are sick or exercising more than usual, you might need to change the dose of your medicine. Do not skip meals. Ask your doctor or health care professional if you should avoid alcohol. Many nonprescription cough and cold products contain sugar or alcohol. These can affect blood sugar. This medicine can make you more sensitive to the sun. Keep out of the sun. If you cannot avoid being in the sun, wear protective clothing and use sunscreen. Do not use sun lamps or tanning beds/booths. Wear a medical ID bracelet or chain, and carry a card that describes your disease and details of your medicine and dosage times. What side effects may I notice from receiving this medicine? Side effects that you should report to your doctor or health care professional as soon as possible: -allergic reactions like skin rash, itching or hives, swelling of the face, lips, or tongue -breathing problems -dark urine -fever, chills, sore throat -signs and symptoms of low blood sugar such as feeling anxious, confusion, dizziness, increased hunger, unusually weak or tired, sweating, shakiness, cold, irritable, headache, blurred vision, fast heartbeat, loss of consciousness -unusual bleeding or bruising -yellowing of the eyes or skin Side effects that usually do not require medical attention (report to your doctor or health care professional if they continue or are bothersome): -diarrhea -dizziness -headache -heartburn -nausea -stomach gas This list may not describe all possible side effects. Call your doctor for medical advice about side effects. You may report side effects to FDA at 1-800-FDA-1088. Where should I keep my medicine? Keep out of the reach of children. Store at room temperature below 30 degrees C (86 degrees F). Throw away any unused medicine after the  expiration date. NOTE: This sheet is a summary. It may not cover all possible information. If you have questions about this medicine, talk to your doctor, pharmacist, or health care provider.    2016, Elsevier/Gold Standard. (2012-08-26 14:29:47)

## 2015-11-10 DIAGNOSIS — G4700 Insomnia, unspecified: Secondary | ICD-10-CM | POA: Insufficient documentation

## 2015-11-10 NOTE — Progress Notes (Signed)
Haley Howell is a 30 y.o. female who [facility] today for insomnia diabetes hypertension opiate withdrawal leg swelling.   1) insomnia: Patient notes continued insomnia. She notes previously trazodone has not helped. She notes that Ambien has helped a great deal in the past. She has never tried Seroquel. She has a history of bipolar disorder that is not currently being treated.  2) opiate withdrawal: Patient recently discontinued methadone. She notes in the past she has done well with clonidine and requests a clotting prescription for opiate withdrawal. She notes agitation and anxiety occasional nausea.  3) leg swelling: Patient has bilateral lower extremity edema and some pain. She denies any trouble breathing and orthopnea fevers chills.  4) diabetes: Patient notes difficulty tolerating metformin. She takes 500 mg once daily and has bothersome diarrhea.     Past Medical History  Diagnosis Date  . Hypertension   . Diabetes mellitus without complication (HCC)   . Anxiety   . Panic attack   . ADHD (attention deficit hyperactivity disorder)   . Stomach ulcer   . Ovarian cyst   . Kidney stone   . H/O degenerative disc disease    Past Surgical History  Procedure Laterality Date  . Cesarean section    . Tonsillectomy    . Tubal ligation     Social History  Substance Use Topics  . Smoking status: Current Every Day Smoker -- 1.00 packs/day    Types: Cigarettes  . Smokeless tobacco: Not on file  . Alcohol Use: No   family history includes Depression in her mother; Diabetes in her father and mother; Hyperlipidemia in her father; Hypertension in her father and mother.  ROS as above:  Medications: Current Outpatient Prescriptions  Medication Sig Dispense Refill  . albuterol (PROAIR HFA) 108 (90 Base) MCG/ACT inhaler Inhale 1 puff into the  lungs.    Marland Kitchen. albuterol (PROVENTIL HFA;VENTOLIN HFA) 108 (90 BASE) MCG/ACT inhaler Inhale 1 puff into the lungs every 6 (six) hours as needed for wheezing or shortness of breath.    Marland Kitchen. aspirin 81 MG EC tablet Take by mouth.    . cloNIDine (CATAPRES) 0.1 MG tablet Take 1 tablet (0.1 mg total) by mouth 3 (three) times daily. 30 tablet 0  . diazepam (VALIUM) 5 MG tablet Take 1 pill by mouth 3x daily for 3 days then Take 1 pill by mouth 2x daily for 3 days then Take 1 pill by moth daily for 3 days. 18 tablet 0  . furosemide (LASIX) 20 MG tablet Take 1 tablet (20 mg total) by mouth daily. 10 tablet 0  . gabapentin (NEURONTIN) 600 MG tablet Take 1 tablet (600 mg total) by mouth 3 (three) times daily. 90 tablet 1  . glimepiride (AMARYL) 2 MG tablet Take 1 tablet (2 mg total) by mouth daily with breakfast. 30 tablet 0  . hydrochlorothiazide (HYDRODIURIL) 25 MG tablet Take 25 mg by mouth daily.    Marland Kitchen. lisdexamfetamine (VYVANSE) 50 MG capsule Take 50 mg by mouth daily.    Marland Kitchen. lisinopril (PRINIVIL,ZESTRIL) 10 MG tablet Take 10 mg by mouth daily.    . metFORMIN (GLUCOPHAGE) 500 MG tablet Take 1 tablet (500 mg total) by mouth daily with breakfast. 60 tablet 0  . promethazine (PHENERGAN) 25 MG tablet Take 1 tablet (25 mg total) by mouth every 8 (eight) hours as needed. for nausea 90 tablet 1  . QUEtiapine (SEROQUEL) 300 MG tablet  Take 1 tablet (300 mg total) by mouth at bedtime. 30 tablet 0   No current facility-administered medications for this visit.   Allergies  Allergen Reactions  . Ibuprofen     Other reaction(s): Cramps (ALLERGY/intolerance)  . Ketorolac Hives  . Nsaids     Stomach ulcer  . Penicillins     rash  . Tolmetin     Stomach ulcer  . Tramadol Hives  . Zofran [Ondansetron Hcl]     Causes headache     Exam:  BP 139/90 mmHg  Pulse 99  Wt 216 lb (97.977 kg)  LMP 10/31/2015 Gen: Well NAD HEENT: EOMI,  MMM Lungs: Normal work of breathing. CTABL Heart: RRR no MRG Abd: NABS, Soft.  Nondistended, Nontender Exts: Brisk capillary refill, warm and well perfused. 1+ pitting bilateral lower extremity edema Psych: Alert and oriented tearful affect. Normal speech and thought process. Neuro: Alert and oriented no tremor normal gait  Lab Results  Component Value Date   HGBA1C 10.6 11/07/2015    Lab Results  Component Value Date   CREATININE 0.64 11/07/2015    No results found for this or any previous visit (from the past 24 hour(s)). No results found.    Assessment and Plan: 30 y.o. female with  1) insomnia: Patient has a history of opiate and benzodiazepine dependence. I think that Ambien is a bad idea. Trial of Seroquel. Refer to psych. Recheck in a week. 2) leg swelling: Creatinine normal. No pulmonary edema on chest exam. Prescribe Lasix recheck in 1 week. 3) diabetes: Add Amaryl. Recheck in 1 week 4) opiate withdrawal: Treat with clonidine 0.1 mg 3 times a day. Recheck in 1 week.   Discussed warning signs or symptoms. Please see discharge instructions. Patient expresses understanding.

## 2015-11-15 ENCOUNTER — Ambulatory Visit (INDEPENDENT_AMBULATORY_CARE_PROVIDER_SITE_OTHER): Payer: Medicaid Other | Admitting: Family Medicine

## 2015-11-15 ENCOUNTER — Ambulatory Visit: Payer: Medicaid Other | Admitting: Family Medicine

## 2015-11-15 ENCOUNTER — Encounter: Payer: Self-pay | Admitting: Family Medicine

## 2015-11-15 VITALS — BP 121/83 | HR 99 | Wt 218.0 lb

## 2015-11-15 DIAGNOSIS — F13230 Sedative, hypnotic or anxiolytic dependence with withdrawal, uncomplicated: Secondary | ICD-10-CM

## 2015-11-15 DIAGNOSIS — E1165 Type 2 diabetes mellitus with hyperglycemia: Secondary | ICD-10-CM

## 2015-11-15 DIAGNOSIS — F1910 Other psychoactive substance abuse, uncomplicated: Secondary | ICD-10-CM

## 2015-11-15 DIAGNOSIS — G4700 Insomnia, unspecified: Secondary | ICD-10-CM

## 2015-11-15 DIAGNOSIS — F1320 Sedative, hypnotic or anxiolytic dependence, uncomplicated: Secondary | ICD-10-CM

## 2015-11-15 DIAGNOSIS — IMO0001 Reserved for inherently not codable concepts without codable children: Secondary | ICD-10-CM

## 2015-11-15 DIAGNOSIS — I10 Essential (primary) hypertension: Secondary | ICD-10-CM

## 2015-11-15 DIAGNOSIS — M7989 Other specified soft tissue disorders: Secondary | ICD-10-CM

## 2015-11-15 DIAGNOSIS — F13930 Sedative, hypnotic or anxiolytic use, unspecified with withdrawal, uncomplicated: Secondary | ICD-10-CM

## 2015-11-15 DIAGNOSIS — F319 Bipolar disorder, unspecified: Secondary | ICD-10-CM

## 2015-11-15 MED ORDER — FUROSEMIDE 20 MG PO TABS: 20.0000 mg | ORAL_TABLET | Freq: Two times a day (BID) | ORAL | Status: AC

## 2015-11-15 MED ORDER — CLONIDINE HCL 0.1 MG PO TABS: 0.1000 mg | ORAL_TABLET | Freq: Three times a day (TID) | ORAL | Status: AC

## 2015-11-15 NOTE — Patient Instructions (Addendum)
Thank you for coming in today. Take lasix in the morning and afternoon.  Get labs today.  Return in 1 month.  Go to [employer].  Continue clonidine.   TENS UNIT: This is helpful for muscle pain and spasm.   Search and Purchase a TENS 7000 2nd edition at www.[email or web address]. It should be less than $30.     TENS unit instructions: Do not shower or bathe with the unit on Turn the unit off before removing electrodes or batteries If the electrodes lose stickiness add a drop of water to the electrodes after they are disconnected from the unit and place on plastic sheet. If you continued to have difficulty, call the TENS unit company to purchase more electrodes. Do not apply lotion on the skin area prior to use. Make sure the skin is clean and dry as this will help prolong the life of the electrodes. After use, always check skin for unusual red areas, rash or other skin difficulties. If there are any skin problems, does not apply electrodes to the same area. Never remove the electrodes from the unit by pulling the wires. Do not use the TENS unit or electrodes other than as directed. Do not change electrode placement without consultating your therapist or physician. Keep 2 fingers with between each electrode. Wear time ratio is 2:1, on to off times.    For example on for 30 minutes off for 15 minutes and then on for 30 minutes off for 15 minutes   Impingement Syndrome, Rotator Cuff, Bursitis With Rehab Impingement syndrome is a condition that involves inflammation of the tendons of the rotator cuff and the subacromial bursa, that causes pain in the shoulder. The rotator cuff consists of four tendons and muscles that control much of the shoulder and upper arm function. The subacromial bursa is a fluid filled sac that helps reduce friction between the rotator cuff and one of the bones of the shoulder (acromion). Impingement syndrome is usually an overuse injury that causes swelling of the bursa  (bursitis), swelling of the tendon (tendonitis), and/or a tear of the tendon (strain). Strains are classified into three categories. Grade 1 strains cause pain, but the tendon is not lengthened. Grade 2 strains include a lengthened ligament, due to the ligament being stretched or partially ruptured. With grade 2 strains there is still function, although the function may be decreased. Grade 3 strains include a complete tear of the tendon or muscle, and function is usually impaired. SYMPTOMS   Pain around the shoulder, often at the outer portion of the upper arm.  Pain that gets worse with shoulder function, especially when reaching overhead or lifting.  Sometimes, aching when not using the arm.  Pain that wakes you up at night.  Sometimes, tenderness, swelling, warmth, or redness over the affected area.  Loss of strength.  Limited motion of the shoulder, especially reaching behind the back (to the back pocket or to unhook bra) or across your body.  Crackling sound (crepitation) when moving the arm.  Biceps tendon pain and inflammation (in the front of the shoulder). Worse when bending the elbow or lifting. CAUSES  Impingement syndrome is often an overuse injury, in which chronic (repetitive) motions cause the tendons or bursa to become inflamed. A strain occurs when a force is paced on the tendon or muscle that is greater than it can withstand. Common mechanisms of injury include: Stress from sudden increase in duration, frequency, or intensity of training.  Direct hit (trauma) to the shoulder.  Aging, erosion of the tendon with normal use.  Bony bump on shoulder (acromial spur). RISK INCREASES WITH:  Contact sports (football, wrestling, boxing).  Throwing sports (baseball, tennis, volleyball).  Weightlifting and bodybuilding.  Heavy labor.  Previous injury to the rotator cuff, including impingement.  Poor shoulder strength and flexibility.  Failure to warm up properly  before activity.  Inadequate protective equipment.  Old age.  Bony bump on shoulder (acromial spur). PREVENTION   Warm up and stretch properly before activity.  Allow for adequate recovery between workouts.  Maintain physical fitness:  Strength, flexibility, and endurance.  Cardiovascular fitness.  Learn and use proper exercise technique. PROGNOSIS  If treated properly, impingement syndrome usually goes away within 6 weeks. Sometimes surgery is required.  RELATED COMPLICATIONS   Longer healing time if not properly treated, or if not given enough time to heal.  Recurring symptoms, that result in a chronic condition.  Shoulder stiffness, frozen shoulder, or loss of motion.  Rotator cuff tendon tear.  Recurring symptoms, especially if activity is resumed too soon, with overuse, with a direct blow, or when using poor technique. TREATMENT  Treatment first involves the use of ice and medicine, to reduce pain and inflammation. The use of strengthening and stretching exercises may help reduce pain with activity. These exercises may be performed at home or with a therapist. If non-surgical treatment is unsuccessful after more than 6 months, surgery may be advised. After surgery and rehabilitation, activity is usually possible in 3 months.  MEDICATION  If pain medicine is needed, nonsteroidal anti-inflammatory medicines (aspirin and ibuprofen), or other minor pain relievers (acetaminophen), are often advised.  Do not take pain medicine for 7 days before surgery.  Prescription pain relievers may be given, if your caregiver thinks they are needed. Use only as directed and only as much as you need.  Corticosteroid injections may be given by your caregiver. These injections should be reserved for the most serious cases, because they may only be given a certain number of times. HEAT AND COLD  Cold treatment (icing) should be applied for 10 to 15 minutes every 2 to 3 hours for  inflammation and pain, and immediately after activity that aggravates your symptoms. Use ice packs or an ice massage.  Heat treatment may be used before performing stretching and strengthening activities prescribed by your caregiver, physical therapist, or athletic trainer. Use a heat pack or a warm water soak. SEEK MEDICAL CARE IF:   Symptoms get worse or do not improve in 4 to 6 weeks, despite treatment.  New, unexplained symptoms develop. (Drugs used in treatment may produce side effects.) EXERCISES  RANGE OF MOTION (ROM) AND STRETCHING EXERCISES - Impingement Syndrome (Rotator Cuff  Tendinitis, Bursitis) These exercises may help you when beginning to rehabilitate your injury. Your symptoms may go away with or without further involvement from your physician, physical therapist or athletic trainer. While completing these exercises, remember:   Restoring tissue flexibility helps normal motion to return to the joints. This allows healthier, less painful movement and activity.  An effective stretch should be held for at least 30 seconds.  A stretch should never be painful. You should only feel a gentle lengthening or release in the stretched tissue. STRETCH - Flexion, Standing  Stand with good posture. With an underhand grip on your right / left hand, and an overhand grip on the opposite hand, grasp a broomstick or cane so that your hands are a little more than shoulder width apart.  Keeping your  right / left elbow straight and shoulder muscles relaxed, push the stick with your opposite hand, to raise your right / left arm in front of your body and then overhead. Raise your arm until you feel a stretch in your right / left shoulder, but before you have increased shoulder pain.  Try to avoid shrugging your right / left shoulder as your arm rises, by keeping your shoulder blade tucked down and toward your mid-back spine. Hold for __________ seconds.  Slowly return to the starting  position. Repeat __________ times. Complete this exercise __________ times per day. STRETCH - Abduction, Supine  Lie on your back. With an underhand grip on your right / left hand and an overhand grip on the opposite hand, grasp a broomstick or cane so that your hands are a little more than shoulder width apart.  Keeping your right / left elbow straight and your shoulder muscles relaxed, push the stick with your opposite hand, to raise your right / left arm out to the side of your body and then overhead. Raise your arm until you feel a stretch in your right / left shoulder, but before you have increased shoulder pain.  Try to avoid shrugging your right / left shoulder as your arm rises, by keeping your shoulder blade tucked down and toward your mid-back spine. Hold for __________ seconds.  Slowly return to the starting position. Repeat __________ times. Complete this exercise __________ times per day. ROM - Flexion, Active-Assisted  Lie on your back. You may bend your knees for comfort.  Grasp a broomstick or cane so your hands are about shoulder width apart. Your right / left hand should grip the end of the stick, so that your hand is positioned "thumbs-up," as if you were about to shake hands.  Using your healthy arm to lead, raise your right / left arm overhead, until you feel a gentle stretch in your shoulder. Hold for __________ seconds.  Use the stick to assist in returning your right / left arm to its starting position. Repeat __________ times. Complete this exercise __________ times per day.  ROM - Internal Rotation, Supine   Lie on your back on a firm surface. Place your right / left elbow about 60 degrees away from your side. Elevate your elbow with a folded towel, so that the elbow and shoulder are the same height.  Using a broomstick or cane and your strong arm, pull your right / left hand toward your body until you feel a gentle stretch, but no increase in your shoulder pain.  Keep your shoulder and elbow in place throughout the exercise.  Hold for __________ seconds. Slowly return to the starting position. Repeat __________ times. Complete this exercise __________ times per day. STRETCH - Internal Rotation  Place your right / left hand behind your back, palm up.  Throw a towel or belt over your opposite shoulder. Grasp the towel with your right / left hand.  While keeping an upright posture, gently pull up on the towel, until you feel a stretch in the front of your right / left shoulder.  Avoid shrugging your right / left shoulder as your arm rises, by keeping your shoulder blade tucked down and toward your mid-back spine.  Hold for __________ seconds. Release the stretch, by lowering your healthy hand. Repeat __________ times. Complete this exercise __________ times per day. ROM - Internal Rotation   Using an underhand grip, grasp a stick behind your back with both hands.  While standing  upright with good posture, slide the stick up your back until you feel a mild stretch in the front of your shoulder.  Hold for __________ seconds. Slowly return to your starting position. Repeat __________ times. Complete this exercise __________ times per day.  STRETCH - Posterior Shoulder Capsule   Stand or sit with good posture. Grasp your right / left elbow and draw it across your chest, keeping it at the same height as your shoulder.  Pull your elbow, so your upper arm comes in closer to your chest. Pull until you feel a gentle stretch in the back of your shoulder.  Hold for __________ seconds. Repeat __________ times. Complete this exercise __________ times per day. STRENGTHENING EXERCISES - Impingement Syndrome (Rotator Cuff Tendinitis, Bursitis) These exercises may help you when beginning to rehabilitate your injury. They may resolve your symptoms with or without further involvement from your physician, physical therapist or athletic trainer. While completing  these exercises, remember:  Muscles can gain both the endurance and the strength needed for everyday activities through controlled exercises.  Complete these exercises as instructed by your physician, physical therapist or athletic trainer. Increase the resistance and repetitions only as guided.  You may experience muscle soreness or fatigue, but the pain or discomfort you are trying to eliminate should never worsen during these exercises. If this pain does get worse, stop and make sure you are following the directions exactly. If the pain is still present after adjustments, discontinue the exercise until you can discuss the trouble with your clinician.  During your recovery, avoid activity or exercises which involve actions that place your injured hand or elbow above your head or behind your back or head. These positions stress the tissues which you are trying to heal. STRENGTH - Scapular Depression and Adduction   With good posture, sit on a firm chair. Support your arms in front of you, with pillows, arm rests, or on a table top. Have your elbows in line with the sides of your body.  Gently draw your shoulder blades down and toward your mid-back spine. Gradually increase the tension, without tensing the muscles along the top of your shoulders and the back of your neck.  Hold for __________ seconds. Slowly release the tension and relax your muscles completely before starting the next repetition.  After you have practiced this exercise, remove the arm support and complete the exercise in standing as well as sitting position. Repeat __________ times. Complete this exercise __________ times per day.  STRENGTH - Shoulder Abductors, Isometric  With good posture, stand or sit about 4-6 inches from a wall, with your right / left side facing the wall.  Bend your right / left elbow. Gently press your right / left elbow into the wall. Increase the pressure gradually, until you are pressing as hard as  you can, without shrugging your shoulder or increasing any shoulder discomfort.  Hold for __________ seconds.  Release the tension slowly. Relax your shoulder muscles completely before you begin the next repetition. Repeat __________ times. Complete this exercise __________ times per day.  STRENGTH - External Rotators, Isometric  Keep your right / left elbow at your side and bend it 90 degrees.  Step into a door frame so that the outside of your right / left wrist can press against the door frame without your upper arm leaving your side.  Gently press your right / left wrist into the door frame, as if you were trying to swing the back of your hand  away from your stomach. Gradually increase the tension, until you are pressing as hard as you can, without shrugging your shoulder or increasing any shoulder discomfort.  Hold for __________ seconds.  Release the tension slowly. Relax your shoulder muscles completely before you begin the next repetition. Repeat __________ times. Complete this exercise __________ times per day.  STRENGTH - Supraspinatus   Stand or sit with good posture. Grasp a __________ weight, or an exercise band or tubing, so that your hand is "thumbs-up," like you are shaking hands.  Slowly lift your right / left arm in a "V" away from your thigh, diagonally into the space between your side and straight ahead. Lift your hand to shoulder height or as far as you can, without increasing any shoulder pain. At first, many people do not lift their hands above shoulder height.  Avoid shrugging your right / left shoulder as your arm rises, by keeping your shoulder blade tucked down and toward your mid-back spine.  Hold for __________ seconds. Control the descent of your hand, as you slowly return to your starting position. Repeat __________ times. Complete this exercise __________ times per day.  STRENGTH - External Rotators  Secure a rubber exercise band or tubing to a fixed  object (table, pole) so that it is at the same height as your right / left elbow when you are standing or sitting on a firm surface.  Stand or sit so that the secured exercise band is at your uninjured side.  Bend your right / left elbow 90 degrees. Place a folded towel or small pillow under your right / left arm, so that your elbow is a few inches away from your side.  Keeping the tension on the exercise band, pull it away from your body, as if pivoting on your elbow. Be sure to keep your body steady, so that the movement is coming only from your rotating shoulder.  Hold for __________ seconds. Release the tension in a controlled manner, as you return to the starting position. Repeat __________ times. Complete this exercise __________ times per day.  STRENGTH - Internal Rotators   Secure a rubber exercise band or tubing to a fixed object (table, pole) so that it is at the same height as your right / left elbow when you are standing or sitting on a firm surface.  Stand or sit so that the secured exercise band is at your right / left side.  Bend your elbow 90 degrees. Place a folded towel or small pillow under your right / left arm so that your elbow is a few inches away from your side.  Keeping the tension on the exercise band, pull it across your body, toward your stomach. Be sure to keep your body steady, so that the movement is coming only from your rotating shoulder.  Hold for __________ seconds. Release the tension in a controlled manner, as you return to the starting position. Repeat __________ times. Complete this exercise __________ times per day.  STRENGTH - Scapular Protractors, Standing   Stand arms length away from a wall. Place your hands on the wall, keeping your elbows straight.  Begin by dropping your shoulder blades down and toward your mid-back spine.  To strengthen your protractors, keep your shoulder blades down, but slide them forward on your rib cage. It will feel as  if you are lifting the back of your rib cage away from the wall. This is a subtle motion and can be challenging to complete. Ask your  caregiver for further instruction, if you are not sure you are doing the exercise correctly.  Hold for __________ seconds. Slowly return to the starting position, resting the muscles completely before starting the next repetition. Repeat __________ times. Complete this exercise __________ times per day. STRENGTH - Scapular Protractors, Supine  Lie on your back on a firm surface. Extend your right / left arm straight into the air while holding a __________ weight in your hand.  Keeping your head and back in place, lift your shoulder off the floor.  Hold for __________ seconds. Slowly return to the starting position, and allow your muscles to relax completely before starting the next repetition. Repeat __________ times. Complete this exercise __________ times per day. STRENGTH - Scapular Protractors, Quadruped  Get onto your hands and knees, with your shoulders directly over your hands (or as close as you can be, comfortably).  Keeping your elbows locked, lift the back of your rib cage up into your shoulder blades, so your mid-back rounds out. Keep your neck muscles relaxed.  Hold this position for __________ seconds. Slowly return to the starting position and allow your muscles to relax completely before starting the next repetition. Repeat __________ times. Complete this exercise __________ times per day.  STRENGTH - Scapular Retractors  Secure a rubber exercise band or tubing to a fixed object (table, pole), so that it is at the height of your shoulders when you are either standing, or sitting on a firm armless chair.  With a palm down grip, grasp an end of the band in each hand. Straighten your elbows and lift your hands straight in front of you, at shoulder height. Step back, away from the secured end of the band, until it becomes tense.  Squeezing your  shoulder blades together, draw your elbows back toward your sides, as you bend them. Keep your upper arms lifted away from your body throughout the exercise.  Hold for __________ seconds. Slowly ease the tension on the band, as you reverse the directions and return to the starting position. Repeat __________ times. Complete this exercise __________ times per day. STRENGTH - Shoulder Extensors   Secure a rubber exercise band or tubing to a fixed object (table, pole) so that it is at the height of your shoulders when you are either standing, or sitting on a firm armless chair.  With a thumbs-up grip, grasp an end of the band in each hand. Straighten your elbows and lift your hands straight in front of you, at shoulder height. Step back, away from the secured end of the band, until it becomes tense.  Squeezing your shoulder blades together, pull your hands down to the sides of your thighs. Do not allow your hands to go behind you.  Hold for __________ seconds. Slowly ease the tension on the band, as you reverse the directions and return to the starting position. Repeat __________ times. Complete this exercise __________ times per day.  STRENGTH - Scapular Retractors and External Rotators   Secure a rubber exercise band or tubing to a fixed object (table, pole) so that it is at the height as your shoulders, when you are either standing, or sitting on a firm armless chair.  With a palm down grip, grasp an end of the band in each hand. Bend your elbows 90 degrees and lift your elbows to shoulder height, at your sides. Step back, away from the secured end of the band, until it becomes tense.  Squeezing your shoulder blades together, rotate your  shoulders so that your upper arms and elbows remain stationary, but your fists travel upward to head height.  Hold for __________ seconds. Slowly ease the tension on the band, as you reverse the directions and return to the starting position. Repeat __________  times. Complete this exercise __________ times per day.  STRENGTH - Scapular Retractors and External Rotators, Rowing   Secure a rubber exercise band or tubing to a fixed object (table, pole) so that it is at the height of your shoulders, when you are either standing, or sitting on a firm armless chair.  With a palm down grip, grasp an end of the band in each hand. Straighten your elbows and lift your hands straight in front of you, at shoulder height. Step back, away from the secured end of the band, until it becomes tense.  Step 1: Squeeze your shoulder blades together. Bending your elbows, draw your hands to your chest, as if you are rowing a boat. At the end of this motion, your hands and elbow should be at shoulder height and your elbows should be out to your sides.  Step 2: Rotate your shoulders, to raise your hands above your head. Your forearms should be vertical and your upper arms should be horizontal.  Hold for __________ seconds. Slowly ease the tension on the band, as you reverse the directions and return to the starting position. Repeat __________ times. Complete this exercise __________ times per day.  STRENGTH - Scapular Depressors  Find a sturdy chair without wheels, such as a dining room chair.  Keeping your feet on the floor, and your hands on the chair arms, lift your bottom up from the seat, and lock your elbows.  Keeping your elbows straight, allow gravity to pull your body weight down. Your shoulders will rise toward your ears.  Raise your body against gravity by drawing your shoulder blades down your back, shortening the distance between your shoulders and ears. Although your feet should always maintain contact with the floor, your feet should progressively support less body weight, as you get stronger.  Hold for __________ seconds. In a controlled and slow manner, lower your body weight to begin the next repetition. Repeat __________ times. Complete this exercise  __________ times per day.    This information is not intended to replace advice given to you by your health care provider. Make sure you discuss any questions you have with your health care provider.   Document Released: 05/13/2005 Document Revised: 06/03/2014 Document Reviewed: 08/25/2008 [employer] Interactive Patient Education [employer]

## 2015-11-15 NOTE — Progress Notes (Signed)
Haley Howell is a 30 y.o. female who presents to [facility]: [facility] Care [facility] Medicine today for hypertension, diabetes, opiate withdrawal, benzodiazepine withdrawal, insomnia, bipolar disorder.  1) hypertension: Doing well with hydrochlorothiazide and clonidine. The chest pains palpitations or shortness of breath.  2) diabetes: Taking metformin and Amaryl. Patient denies any hypoglycemic episodes or polyuria or polydipsia.  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 Doing well with clonidine.  4) benzodiazepine withdrawal currently on Valium taper doing well.  5) insomnia: Doing well with Seroquel at night. She does note some morning grogginess. She would like Ambien if possible.  6) bipolar disorder. Patient has not yet heard from psychiatry. She notes that she can be seen by [facility] tomorrow if needed.  7) leg swelling: Patient has leg swelling and had Lasix 20 mg daily prescribed last visit. She notes this helps but not fully. She feels as though she urinates significantly when she takes Lasix. No shortness of breath.   Past Medical History  Diagnosis Date  . Hypertension   . Diabetes mellitus without complication (HCC)   . Anxiety   . Panic attack   . ADHD (attention deficit hyperactivity disorder)   . Stomach ulcer   . Ovarian cyst   . Kidney stone   . H/O degenerative disc disease    Past Surgical History  Procedure Laterality Date  . Cesarean section    . Tonsillectomy    . Tubal ligation     Social History  Substance Use Topics  . Smoking status: Current Every Day Smoker -- 1.00 packs/day    Types: Cigarettes  . Smokeless tobacco: Not on file  . Alcohol Use: No   family history includes Depression in her mother; Diabetes in her father and mother; Hyperlipidemia in her father; Hypertension in her father and mother.  ROS as above:  Medications: Current  Outpatient Prescriptions  Medication Sig Dispense Refill  . albuterol (PROAIR HFA) 108 (90 Base) MCG/ACT inhaler Inhale 1 puff into the lungs.    Marland Kitchen albuterol (PROVENTIL HFA;VENTOLIN HFA) 108 (90 BASE) MCG/ACT inhaler Inhale 1 puff into the lungs every 6 (six) hours as needed for wheezing or shortness of breath.    Marland Kitchen aspirin 81 MG EC tablet Take by mouth.    . cloNIDine (CATAPRES) 0.1 MG tablet Take 1 tablet (0.1 mg total) by mouth 3 (three) times daily. 90 tablet 2  . diazepam (VALIUM) 5 MG tablet Take 1 pill by mouth 3x daily for 3 days then Take 1 pill by mouth 2x daily for 3 days then Take 1 pill by moth daily for 3 days. 18 tablet 0  . furosemide (LASIX) 20 MG tablet Take 1 tablet (20 mg total) by mouth 2 (two) times daily. 60 tablet 0  . gabapentin (NEURONTIN) 600 MG tablet Take 1 tablet (600 mg total) by mouth 3 (three) times daily. 90 tablet 1  . glimepiride (AMARYL) 2 MG tablet Take 1 tablet (2 mg total) by mouth daily with breakfast. 30 tablet 0  . hydrochlorothiazide (HYDRODIURIL) 25 MG tablet Take 25 mg by mouth daily.    Marland Kitchen lisdexamfetamine (VYVANSE) 50 MG capsule Take 50 mg by mouth daily.    Marland Kitchen lisinopril (PRINIVIL,ZESTRIL) 10 MG tablet Take 10 mg by mouth daily.    . metFORMIN (GLUCOPHAGE) 500 MG tablet Take 1 tablet (500 mg total) by mouth daily with breakfast. 60 tablet 0  . promethazine (PHENERGAN) 25 MG tablet Take 1 tablet (25  mg total) by mouth every 8 (eight) hours as needed. for nausea 90 tablet 1  . QUEtiapine (SEROQUEL) 300 MG tablet Take 1 tablet (300 mg total) by mouth at bedtime. 30 tablet 0   No current facility-administered medications for this visit.   Allergies  Allergen Reactions  . Ibuprofen     Other reaction(s): Cramps (ALLERGY/intolerance)  . Ketorolac Hives  . Nsaids     Stomach ulcer  . Penicillins     rash  . Tolmetin     Stomach ulcer  . Tramadol Hives  . Zofran [Ondansetron Hcl]     Causes headache     Exam:  BP 121/83 mmHg  Pulse 99   Wt 218 lb (98.884 kg)  SpO2 94%  LMP 10/31/2015 Gen: Well NAD HEENT: EOMI,  MMM Lungs: Normal work of breathing. CTABL Heart: RRR no MRG Abd: NABS, Soft. Nondistended, Nontender Exts: Brisk capillary refill, warm and well perfused. 1+ edema bilateral lower extremities Psych: Slightly flattened affect. Normal speech thought process. No SI or HI expressed. Neuro: No tremor.  No results found for this or any previous visit (from the past 24 hour(s)). No results found.    Assessment and Plan: 30 y.o. female with  1) hypertension: Doing well. Continue current regimen including clonidine 2) diabetes: Doing well. Recheck labs in a few months. 3) opiate withdrawal: Doing well with clonidine. Continue current regimen 4) benzodiazepine withdrawal: Doing well. Will not prescribe further benzodiazepine or other controlled substances in this office. 5) insomnia: Continue Seroquel 6) bipolar disorder: Follow-up with [facility]. 7) leg swelling partial improvement with Lasix. Increase Lasix to twice daily and check metabolic panel  Discussed warning signs or symptoms. Please see discharge instructions. Patient expresses understanding.

## 2015-11-16 ENCOUNTER — Ambulatory Visit: Payer: Medicaid Other | Admitting: Family Medicine

## 2015-11-16 LAB — PAIN MGMT, PROFILE 6 CONF W/O MM, U
6 Acetylmorphine: NEGATIVE ng/mL (ref <10)
ALPHAHYDROXYALPRAZOLAM: 2201 ng/mL — AB (ref <25)
Alcohol Metabolites: NEGATIVE ng/mL (ref <500)
Alphahydroxymidazolam: NEGATIVE ng/mL (ref <50)
Alphahydroxytriazolam: NEGATIVE ng/mL (ref <50)
Aminoclonazepam: 838 ng/mL — ABNORMAL HIGH (ref <25)
Amphetamines: NEGATIVE ng/mL (ref <500)
Barbiturates: NEGATIVE ng/mL (ref <300)
Benzodiazepines: POSITIVE ng/mL — AB (ref <100)
Cocaine Metabolite: NEGATIVE ng/mL (ref <150)
Creatinine: 144 mg/dL (ref >=20.0)
EDDP: 19643 ng/mL — AB (ref <100)
HYDROXYETHYLFLURAZEPAM: NEGATIVE ng/mL (ref <50)
LORAZEPAM: NEGATIVE ng/mL (ref <50)
Marijuana Metabolite: NEGATIVE ng/mL (ref <20)
Methadone Metabolite: POSITIVE ng/mL — AB (ref <100)
Methadone: 27792 ng/mL — ABNORMAL HIGH (ref <100)
Nordiazepam: NEGATIVE ng/mL (ref <50)
OPIATES: NEGATIVE ng/mL (ref <100)
OXAZEPAM: NEGATIVE ng/mL (ref <50)
OXYCODONE: NEGATIVE ng/mL (ref <100)
Oxidant: NEGATIVE ug/mL (ref <200)
PLEASE NOTE: 0
Phencyclidine: NEGATIVE ng/mL (ref <25)
TEMAZEPAM: NEGATIVE ng/mL (ref <50)
pH: 7.33 (ref 4.5–9.0)

## 2015-11-16 LAB — BASIC METABOLIC PANEL
BUN: 6 mg/dL — ABNORMAL LOW (ref 7–25)
CO2: 24 mmol/L (ref 20–31)
Calcium: 9.4 mg/dL (ref 8.6–10.2)
Chloride: 104 mmol/L (ref 98–110)
Creat: 0.72 mg/dL (ref 0.50–1.10)
Glucose, Bld: 108 mg/dL — ABNORMAL HIGH (ref 65–99)
POTASSIUM: 4.1 mmol/L (ref 3.5–5.3)
Sodium: 138 mmol/L (ref 135–146)

## 2015-11-16 NOTE — Progress Notes (Signed)
Quick Note:  Urine drug screen was as expected positive for Xanax and methadone. Negative for everything else. This is normal for what we were expecting. ______

## 2015-11-16 NOTE — Progress Notes (Signed)
Quick Note:  Potassium is stable ______

## 2015-12-15 ENCOUNTER — Ambulatory Visit (INDEPENDENT_AMBULATORY_CARE_PROVIDER_SITE_OTHER): Payer: Medicaid Other | Admitting: Family Medicine

## 2015-12-15 DIAGNOSIS — Z5329 Procedure and treatment not carried out because of patient's decision for other reasons: Secondary | ICD-10-CM

## 2015-12-15 NOTE — Progress Notes (Signed)
No show

## 2015-12-19 ENCOUNTER — Ambulatory Visit (INDEPENDENT_AMBULATORY_CARE_PROVIDER_SITE_OTHER): Payer: Medicaid Other | Admitting: Family Medicine

## 2015-12-19 DIAGNOSIS — Z5329 Procedure and treatment not carried out because of patient's decision for other reasons: Secondary | ICD-10-CM

## 2015-12-19 NOTE — Progress Notes (Signed)
No show x3.  Dismissed from practice

## 2016-01-04 ENCOUNTER — Other Ambulatory Visit: Payer: Self-pay | Admitting: Family Medicine

## 2016-01-25 ENCOUNTER — Other Ambulatory Visit: Payer: Self-pay | Admitting: Family Medicine

## 2016-03-10 ENCOUNTER — Encounter (HOSPITAL_COMMUNITY): Payer: Self-pay | Admitting: Emergency Medicine

## 2016-03-10 ENCOUNTER — Inpatient Hospital Stay (HOSPITAL_COMMUNITY)
Admission: EM | Admit: 2016-03-10 | Discharge: 2016-03-11 | DRG: 872 | Discharge status: Against Medical Advice | Payer: Medicaid Other | Attending: Internal Medicine | Admitting: Internal Medicine

## 2016-03-10 DIAGNOSIS — F1910 Other psychoactive substance abuse, uncomplicated: Secondary | ICD-10-CM

## 2016-03-10 DIAGNOSIS — Z79899 Other long term (current) drug therapy: Secondary | ICD-10-CM

## 2016-03-10 DIAGNOSIS — F1120 Opioid dependence, uncomplicated: Secondary | ICD-10-CM | POA: Diagnosis present

## 2016-03-10 DIAGNOSIS — Z886 Allergy status to analgesic agent status: Secondary | ICD-10-CM | POA: Diagnosis not present

## 2016-03-10 DIAGNOSIS — Z8711 Personal history of peptic ulcer disease: Secondary | ICD-10-CM | POA: Diagnosis not present

## 2016-03-10 DIAGNOSIS — E1140 Type 2 diabetes mellitus with diabetic neuropathy, unspecified: Secondary | ICD-10-CM

## 2016-03-10 DIAGNOSIS — Z87442 Personal history of urinary calculi: Secondary | ICD-10-CM

## 2016-03-10 DIAGNOSIS — F909 Attention-deficit hyperactivity disorder, unspecified type: Secondary | ICD-10-CM | POA: Diagnosis present

## 2016-03-10 DIAGNOSIS — E876 Hypokalemia: Secondary | ICD-10-CM

## 2016-03-10 DIAGNOSIS — Z7982 Long term (current) use of aspirin: Secondary | ICD-10-CM

## 2016-03-10 DIAGNOSIS — Z818 Family history of other mental and behavioral disorders: Secondary | ICD-10-CM | POA: Diagnosis not present

## 2016-03-10 DIAGNOSIS — F17210 Nicotine dependence, cigarettes, uncomplicated: Secondary | ICD-10-CM | POA: Diagnosis present

## 2016-03-10 DIAGNOSIS — E1142 Type 2 diabetes mellitus with diabetic polyneuropathy: Secondary | ICD-10-CM | POA: Diagnosis present

## 2016-03-10 DIAGNOSIS — F319 Bipolar disorder, unspecified: Secondary | ICD-10-CM | POA: Diagnosis present

## 2016-03-10 DIAGNOSIS — Z880 Allergy status to penicillin: Secondary | ICD-10-CM

## 2016-03-10 DIAGNOSIS — Z885 Allergy status to narcotic agent status: Secondary | ICD-10-CM

## 2016-03-10 DIAGNOSIS — I10 Essential (primary) hypertension: Secondary | ICD-10-CM | POA: Diagnosis present

## 2016-03-10 DIAGNOSIS — A419 Sepsis, unspecified organism: Secondary | ICD-10-CM | POA: Diagnosis present

## 2016-03-10 DIAGNOSIS — K219 Gastro-esophageal reflux disease without esophagitis: Secondary | ICD-10-CM | POA: Diagnosis present

## 2016-03-10 DIAGNOSIS — L0390 Cellulitis, unspecified: Secondary | ICD-10-CM

## 2016-03-10 DIAGNOSIS — F1990 Other psychoactive substance use, unspecified, uncomplicated: Secondary | ICD-10-CM | POA: Diagnosis present

## 2016-03-10 DIAGNOSIS — Z8249 Family history of ischemic heart disease and other diseases of the circulatory system: Secondary | ICD-10-CM

## 2016-03-10 DIAGNOSIS — E1165 Type 2 diabetes mellitus with hyperglycemia: Secondary | ICD-10-CM | POA: Diagnosis present

## 2016-03-10 DIAGNOSIS — IMO0002 Reserved for concepts with insufficient information to code with codable children: Secondary | ICD-10-CM | POA: Diagnosis present

## 2016-03-10 DIAGNOSIS — Z7984 Long term (current) use of oral hypoglycemic drugs: Secondary | ICD-10-CM

## 2016-03-10 DIAGNOSIS — F419 Anxiety disorder, unspecified: Secondary | ICD-10-CM

## 2016-03-10 DIAGNOSIS — F410 Panic disorder [episodic paroxysmal anxiety] without agoraphobia: Secondary | ICD-10-CM | POA: Diagnosis present

## 2016-03-10 DIAGNOSIS — Z888 Allergy status to other drugs, medicaments and biological substances status: Secondary | ICD-10-CM | POA: Diagnosis not present

## 2016-03-10 DIAGNOSIS — M7989 Other specified soft tissue disorders: Secondary | ICD-10-CM | POA: Diagnosis not present

## 2016-03-10 DIAGNOSIS — Z833 Family history of diabetes mellitus: Secondary | ICD-10-CM

## 2016-03-10 DIAGNOSIS — B9689 Other specified bacterial agents as the cause of diseases classified elsewhere: Secondary | ICD-10-CM | POA: Diagnosis not present

## 2016-03-10 DIAGNOSIS — L03114 Cellulitis of left upper limb: Secondary | ICD-10-CM | POA: Diagnosis present

## 2016-03-10 DIAGNOSIS — F17200 Nicotine dependence, unspecified, uncomplicated: Secondary | ICD-10-CM

## 2016-03-10 LAB — I-STAT BETA HCG BLOOD, ED (MC, WL, AP ONLY): I-stat hCG, quantitative: <5 m[IU]/mL (ref <5)

## 2016-03-10 LAB — CBC WITH DIFFERENTIAL/PLATELET
BASOS ABS: 0 10*3/uL (ref 0.0–0.1)
BASOS PCT: 0 %
Eosinophils Absolute: 0 10*3/uL (ref 0.0–0.7)
Eosinophils Relative: 0 %
HEMATOCRIT: 40.8 % (ref 36.0–46.0)
HEMOGLOBIN: 14.1 g/dL (ref 12.0–15.0)
Lymphocytes Relative: 14 %
Lymphs Abs: 2.3 10*3/uL (ref 0.7–4.0)
MCH: 30 pg (ref 26.0–34.0)
MCHC: 34.6 g/dL (ref 30.0–36.0)
MCV: 86.8 fL (ref 78.0–100.0)
Monocytes Absolute: 1.3 10*3/uL — ABNORMAL HIGH (ref 0.1–1.0)
Monocytes Relative: 8 %
NEUTROS ABS: 13.2 10*3/uL — AB (ref 1.7–7.7)
NEUTROS PCT: 78 %
Platelets: 159 10*3/uL (ref 150–400)
RBC: 4.7 MIL/uL (ref 3.87–5.11)
RDW: 13.9 % (ref 11.5–15.5)
WBC: 16.8 10*3/uL — AB (ref 4.0–10.5)

## 2016-03-10 LAB — RAPID URINE DRUG SCREEN, HOSP PERFORMED
AMPHETAMINES: POSITIVE — AB
Barbiturates: NOT DETECTED
Benzodiazepines: NOT DETECTED
Cocaine: NOT DETECTED
OPIATES: POSITIVE — AB
TETRAHYDROCANNABINOL: NOT DETECTED

## 2016-03-10 LAB — COMPREHENSIVE METABOLIC PANEL
ALT: 23 U/L (ref 14–54)
AST: 20 U/L (ref 15–41)
Albumin: 3.7 g/dL (ref 3.5–5.0)
Alkaline Phosphatase: 91 U/L (ref 38–126)
Anion gap: 12 (ref 5–15)
BILIRUBIN TOTAL: 0.8 mg/dL (ref 0.3–1.2)
BUN: 6 mg/dL (ref 6–20)
CALCIUM: 9.6 mg/dL (ref 8.9–10.3)
CO2: 25 mmol/L (ref 22–32)
CREATININE: 0.85 mg/dL (ref 0.44–1.00)
Chloride: 98 mmol/L — ABNORMAL LOW (ref 101–111)
GFR calc Af Amer: >60 mL/min (ref >60)
Glucose, Bld: 147 mg/dL — ABNORMAL HIGH (ref 65–99)
POTASSIUM: 3 mmol/L — AB (ref 3.5–5.1)
Sodium: 135 mmol/L (ref 135–145)
TOTAL PROTEIN: 7.5 g/dL (ref 6.5–8.1)

## 2016-03-10 LAB — URINALYSIS, ROUTINE W REFLEX MICROSCOPIC
Bilirubin Urine: NEGATIVE
Glucose, UA: 100 mg/dL — AB
Ketones, ur: NEGATIVE mg/dL
LEUKOCYTES UA: NEGATIVE
NITRITE: POSITIVE — AB
PH: 6 (ref 5.0–8.0)
Protein, ur: 30 mg/dL — AB
SPECIFIC GRAVITY, URINE: 1.014 (ref 1.005–1.030)

## 2016-03-10 LAB — GLUCOSE, CAPILLARY
GLUCOSE-CAPILLARY: 172 mg/dL — AB (ref 65–99)
Glucose-Capillary: 144 mg/dL — ABNORMAL HIGH (ref 65–99)

## 2016-03-10 LAB — I-STAT CG4 LACTIC ACID, ED
LACTIC ACID, VENOUS: 2.39 mmol/L — AB (ref 0.5–1.9)
LACTIC ACID, VENOUS: 1.31 mmol/L (ref 0.5–1.9)

## 2016-03-10 LAB — URINE MICROSCOPIC-ADD ON

## 2016-03-10 MED ORDER — ACETAMINOPHEN 500 MG PO TABS
1000.0000 mg | ORAL_TABLET | Freq: Four times a day (QID) | ORAL | Status: DC | PRN
  Administered 2016-03-11: 1000 mg via ORAL
  Filled 2016-03-10: qty 2

## 2016-03-10 MED ORDER — ALPRAZOLAM ER 0.5 MG PO TB24: 0.5000 mg | ORAL_TABLET | Freq: Every day | ORAL | Status: DC

## 2016-03-10 MED ORDER — ASPIRIN EC 81 MG PO TBEC
81.0000 mg | DELAYED_RELEASE_TABLET | Freq: Every day | ORAL | Status: DC
  Administered 2016-03-10 – 2016-03-11 (×2): 81 mg via ORAL
  Filled 2016-03-10 (×4): qty 1

## 2016-03-10 MED ORDER — ACETAMINOPHEN 325 MG PO TABS
ORAL_TABLET | ORAL | Status: AC
  Filled 2016-03-10: qty 2

## 2016-03-10 MED ORDER — ACETAMINOPHEN 325 MG PO TABS
650.0000 mg | ORAL_TABLET | Freq: Once | ORAL | Status: AC
  Administered 2016-03-10: 650 mg via ORAL

## 2016-03-10 MED ORDER — CLONIDINE HCL 0.1 MG PO TABS
0.1000 mg | ORAL_TABLET | Freq: Three times a day (TID) | ORAL | Status: DC
  Administered 2016-03-10 – 2016-03-11 (×2): 0.1 mg via ORAL
  Filled 2016-03-10 (×2): qty 1

## 2016-03-10 MED ORDER — METRONIDAZOLE 500 MG PO TABS
500.0000 mg | ORAL_TABLET | Freq: Three times a day (TID) | ORAL | Status: DC
  Administered 2016-03-10: 500 mg via ORAL
  Filled 2016-03-10 (×2): qty 1

## 2016-03-10 MED ORDER — KETOROLAC TROMETHAMINE 15 MG/ML IJ SOLN: 15.0000 mg | Freq: Once | INTRAMUSCULAR | Status: DC

## 2016-03-10 MED ORDER — VANCOMYCIN HCL IN DEXTROSE 1-5 GM/200ML-% IV SOLN
1000.0000 mg | Freq: Once | INTRAVENOUS | Status: AC
  Administered 2016-03-10: 1000 mg via INTRAVENOUS
  Filled 2016-03-10: qty 200

## 2016-03-10 MED ORDER — DEXTROSE 5 % IV SOLN
2.0000 g | INTRAVENOUS | Status: DC
  Administered 2016-03-10: 2 g via INTRAVENOUS
  Filled 2016-03-10 (×2): qty 2

## 2016-03-10 MED ORDER — DOXYCYCLINE HYCLATE 100 MG IV SOLR
100.0000 mg | Freq: Two times a day (BID) | INTRAVENOUS | Status: DC
  Administered 2016-03-10 – 2016-03-11 (×2): 100 mg via INTRAVENOUS
  Filled 2016-03-10 (×3): qty 100

## 2016-03-10 MED ORDER — PROMETHAZINE HCL 25 MG/ML IJ SOLN
25.0000 mg | Freq: Four times a day (QID) | INTRAMUSCULAR | Status: DC | PRN
  Administered 2016-03-11: 25 mg via INTRAVENOUS
  Filled 2016-03-10: qty 1

## 2016-03-10 MED ORDER — NICOTINE 7 MG/24HR TD PT24
7.0000 mg | MEDICATED_PATCH | Freq: Every day | TRANSDERMAL | Status: DC
  Administered 2016-03-10 – 2016-03-11 (×2): 7 mg via TRANSDERMAL
  Filled 2016-03-10 (×2): qty 1

## 2016-03-10 MED ORDER — VANCOMYCIN HCL IN DEXTROSE 1-5 GM/200ML-% IV SOLN
1000.0000 mg | Freq: Three times a day (TID) | INTRAVENOUS | Status: DC
  Filled 2016-03-10 (×2): qty 200

## 2016-03-10 MED ORDER — DIPHENHYDRAMINE HCL 25 MG PO CAPS: 25.0000 mg | ORAL_CAPSULE | Freq: Once | ORAL | Status: DC

## 2016-03-10 MED ORDER — POTASSIUM CHLORIDE CRYS ER 20 MEQ PO TBCR
40.0000 meq | EXTENDED_RELEASE_TABLET | Freq: Once | ORAL | Status: AC
  Administered 2016-03-10: 40 meq via ORAL
  Filled 2016-03-10: qty 2

## 2016-03-10 MED ORDER — HYDROMORPHONE HCL 1 MG/ML IJ SOLN
1.0000 mg | Freq: Once | INTRAMUSCULAR | Status: DC
Start: 2016-03-10 — End: 2016-03-10
  Administered 2016-03-10: 1 mg via INTRAVENOUS
  Filled 2016-03-10: qty 1

## 2016-03-10 MED ORDER — DIPHENHYDRAMINE HCL 25 MG PO CAPS
50.0000 mg | ORAL_CAPSULE | Freq: Four times a day (QID) | ORAL | Status: DC | PRN
  Administered 2016-03-10: 50 mg via ORAL
  Filled 2016-03-10: qty 2

## 2016-03-10 MED ORDER — ACETAMINOPHEN 650 MG RE SUPP: 650.0000 mg | Freq: Four times a day (QID) | RECTAL | Status: DC | PRN

## 2016-03-10 MED ORDER — SODIUM CHLORIDE 0.9 % IV BOLUS (SEPSIS)
1000.0000 mL | Freq: Once | INTRAVENOUS | Status: AC
Start: 2016-03-10 — End: 2016-03-10
  Administered 2016-03-10: 1000 mL via INTRAVENOUS

## 2016-03-10 MED ORDER — SODIUM CHLORIDE 0.9 % IV BOLUS (SEPSIS)
1000.0000 mL | Freq: Once | INTRAVENOUS | Status: AC
  Administered 2016-03-10: 1000 mL via INTRAVENOUS

## 2016-03-10 MED ORDER — LOPERAMIDE HCL 2 MG PO CAPS: 4.0000 mg | ORAL_CAPSULE | Freq: Once | ORAL | Status: DC | PRN

## 2016-03-10 MED ORDER — SODIUM CHLORIDE 0.9 % IV SOLN
INTRAVENOUS | Status: DC
  Administered 2016-03-10: 18:00:00 via INTRAVENOUS

## 2016-03-10 MED ORDER — ENOXAPARIN SODIUM 40 MG/0.4ML ~~LOC~~ SOLN
40.0000 mg | SUBCUTANEOUS | Status: DC
  Filled 2016-03-10: qty 0.4

## 2016-03-10 MED ORDER — GABAPENTIN 600 MG PO TABS
600.0000 mg | ORAL_TABLET | Freq: Three times a day (TID) | ORAL | Status: DC
  Administered 2016-03-10 – 2016-03-11 (×2): 600 mg via ORAL
  Filled 2016-03-10 (×3): qty 1

## 2016-03-10 MED ORDER — ACETAMINOPHEN 500 MG PO TABS
1000.0000 mg | ORAL_TABLET | Freq: Once | ORAL | Status: DC
  Filled 2016-03-10: qty 2

## 2016-03-10 MED ORDER — METOCLOPRAMIDE HCL 5 MG/ML IJ SOLN
10.0000 mg | Freq: Once | INTRAMUSCULAR | Status: DC
Start: 2016-03-10 — End: 2016-03-10
  Filled 2016-03-10: qty 2

## 2016-03-10 MED ORDER — METRONIDAZOLE IN NACL 5-0.79 MG/ML-% IV SOLN: 500.0000 mg | Freq: Once | INTRAVENOUS | Status: DC

## 2016-03-10 MED ORDER — SODIUM CHLORIDE 0.9 % IV BOLUS (SEPSIS): 500.0000 mL | Freq: Once | INTRAVENOUS | Status: DC

## 2016-03-10 MED ORDER — PROMETHAZINE HCL 25 MG PO TABS: 12.5000 mg | ORAL_TABLET | Freq: Four times a day (QID) | ORAL | Status: DC | PRN

## 2016-03-10 MED ORDER — INSULIN ASPART 100 UNIT/ML ~~LOC~~ SOLN
0.0000 [IU] | Freq: Three times a day (TID) | SUBCUTANEOUS | Status: DC
  Administered 2016-03-11: 1 [IU] via SUBCUTANEOUS
  Administered 2016-03-11: 2 [IU] via SUBCUTANEOUS

## 2016-03-10 MED ORDER — DEXTROSE 5 % IV SOLN: 2.0000 g | Freq: Once | INTRAVENOUS | Status: DC

## 2016-03-10 MED ORDER — OXYCODONE HCL 5 MG PO TABS
10.0000 mg | ORAL_TABLET | Freq: Four times a day (QID) | ORAL | Status: DC | PRN
  Administered 2016-03-10 – 2016-03-11 (×3): 10 mg via ORAL
  Filled 2016-03-10 (×3): qty 2

## 2016-03-10 MED ORDER — LOPERAMIDE HCL 2 MG PO CAPS: 2.0000 mg | ORAL_CAPSULE | ORAL | Status: DC | PRN

## 2016-03-10 MED ORDER — BACLOFEN 10 MG PO TABS: 5.0000 mg | ORAL_TABLET | Freq: Four times a day (QID) | ORAL | Status: DC | PRN

## 2016-03-10 MED ORDER — PROMETHAZINE HCL 25 MG PO TABS
25.0000 mg | ORAL_TABLET | Freq: Four times a day (QID) | ORAL | Status: DC | PRN
  Administered 2016-03-10: 25 mg via ORAL
  Filled 2016-03-10: qty 1

## 2016-03-10 NOTE — ED Notes (Addendum)
Called nurse on 6E to advise IV, meds that were given. Mardelle MatteAndy, RN.

## 2016-03-10 NOTE — ED Notes (Signed)
Unsuccessful IV start x 1.  Unable to use left arm d/t cellulitis.  IV team notified.

## 2016-03-10 NOTE — ED Provider Notes (Signed)
[facility]-EMERGENCY DEPT Provider Note   CSN: [id number] Arrival date & time: 03/10/16  4098     History   Chief Complaint Chief Complaint  Patient presents with  . Abscess    HPI Haley Howell is a 30 y.o. female with a past medical history of diabetes and IV drug use who presents the emergency department with chief complaint of left arm infection. Patient uses a daily heroin abuser. She complains of severe pain and swelling in her left arm. She states that she thinks the site of the infection comes from her left before meals. The patient has been running a fever. She states that she feels miserable. She denies any other sites of infection. She denies altered mental status or changes in mentation. She denies chest pain, shortness of breath. Last IV drug use was yesterday.  HPI  Past Medical History:  Diagnosis Date  . ADHD (attention deficit hyperactivity disorder)   . Anxiety   . Diabetes mellitus without complication (HCC)   . H/O degenerative disc disease   . Hypertension   . Kidney stone   . Ovarian cyst   . Panic attack   . Stomach ulcer     Patient Active Problem List   Diagnosis Date Noted  . Sepsis (HCC) 03/10/2016  . Insomnia 11/10/2015  . Benzodiazepine withdrawal (HCC) 11/07/2015  . Leg swelling 11/07/2015  . Obstructive apnea 07/21/2015  . Abnormal cervical Papanicolaou smear 02/09/2015  . Degeneration of intervertebral disc of cervical region 02/09/2015  . Class 1 obesity 02/09/2015  . 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 Other specified behavioral and emotional disorders with onset usually occurring in childhood and adolescence 09/02/2014  . Bipolar I disorder (HCC) 09/02/2014  . Type 2 diabetes mellitus, uncontrolled (HCC) 09/02/2014  . 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 Chronic pain 01/20/2014  . Drug-seeking behavior 10/02/2011  . Psychoactive substance abuse 10/02/2011  . Gastro-esophageal reflux disease without esophagitis  09/26/2011  . Essential (primary) hypertension 03/29/2009    Past Surgical History:  Procedure Laterality Date  . CESAREAN SECTION    . TONSILLECTOMY    . TUBAL LIGATION      OB History    No data available       Home Medications    Prior to Admission medications   Medication Sig Start Date End Date Taking? Authorizing Provider  albuterol (PROVENTIL HFA;VENTOLIN HFA) 108 (90 BASE) MCG/ACT inhaler Inhale 1 puff into the lungs every 6 (six) hours as needed for wheezing or shortness of breath.   Yes Historical Provider, MD  aspirin 81 MG EC tablet Take 81 mg by mouth daily.    Yes Historical Provider, MD  cloNIDine (CATAPRES) 0.1 MG tablet Take 1 tablet (0.1 mg total) by mouth 3 (three) times daily. 11/15/15  Yes Rodolph Bong, MD  gabapentin (NEURONTIN) 600 MG tablet Take 1 tablet (600 mg total) by mouth 3 (three) times daily. 11/07/15  Yes Rodolph Bong, MD  lisdexamfetamine (VYVANSE) 50 MG capsule Take 50 mg by mouth daily.   Yes Historical Provider, MD  metFORMIN (GLUCOPHAGE) 500 MG tablet Take 1 tablet (500 mg total) by mouth daily with breakfast. 11/07/15  Yes Rodolph Bong, MD  promethazine (PHENERGAN) 25 MG tablet Take 1 tablet (25 mg total) by mouth every 8 (eight) hours as needed. for nausea 11/09/15  Yes Rodolph Bong, MD  zolpidem (AMBIEN) 10 MG tablet Take 10 mg by mouth at bedtime as needed for sleep.   Yes Historical Provider, MD  diazepam (VALIUM) 5 MG tablet Take 1 pill by mouth 3x daily for 3 days then Take 1 pill by mouth 2x daily for 3 days then Take 1 pill by moth daily for 3 days. Patient not taking: Reported on 03/10/2016 11/07/15   Rodolph Bong, MD  furosemide (LASIX) 20 MG tablet Take 1 tablet (20 mg total) by mouth 2 (two) times daily. Patient not taking: Reported on 03/10/2016 11/15/15   Rodolph Bong, MD  glimepiride (AMARYL) 2 MG tablet Take 1 tablet (2 mg total) by mouth daily with breakfast. Patient not taking: Reported on 03/10/2016 11/09/15   Rodolph Bong, MD    QUEtiapine (SEROQUEL) 300 MG tablet Take 1 tablet (300 mg total) by mouth at bedtime. Patient not taking: Reported on 03/10/2016 11/09/15   Rodolph Bong, MD    Family History Family History  Problem Relation Age of Onset  . Depression Mother   . Diabetes Mother   . Hypertension Mother   . Diabetes Father   . Hyperlipidemia Father   . Hypertension Father     Social History Social History  Substance Use Topics  . Smoking status: Current Every Day Smoker    Packs/day: 1.00    Types: Cigarettes  . Smokeless tobacco: Never Used  . Alcohol use No     Allergies   Ibuprofen; Ketorolac; Nsaids; Penicillins; Tolmetin; Tramadol; and Zofran [ondansetron hcl]   Review of Systems Review of Systems  Ten systems reviewed and are negative for acute change, except as noted in the HPI.   Physical Exam Updated Vital Signs BP 134/82   Pulse 102   Temp 100.5 F (38.1 C) (Oral)   Resp (!) 32   Ht 5\' 5"  (1.651 m)   Wt 81.6 kg   SpO2 94%   BMI 29.95 kg/m   Physical Exam  Constitutional: She is oriented to person, place, and time. She appears well-developed and well-nourished. She appears toxic.  HENT:  Head: Normocephalic and atraumatic.  Eyes: Conjunctivae are normal. No scleral icterus.  Neck: Normal range of motion.  Cardiovascular: Regular rhythm and normal heart sounds.  Exam reveals no gallop and no friction rub.   No murmur heard. Tachycardic, no murmurs auscultated  Pulmonary/Chest: Effort normal and breath sounds normal. No respiratory distress.  Abdominal: Soft. Bowel sounds are normal. She exhibits no distension and no mass. There is no tenderness. There is no guarding.  Neurological: She is alert and oriented to person, place, and time.  Skin:  Track marks in the BL hands and forearms. Hands exquisitely tender left arm with erythema from the left before meals and mentating up to the proximal left arm and mid left forearm. No palpable abscess.  Normal distal pulses.  No lymphangitis.          ED Treatments / Results  Labs (all labs ordered are listed, but only abnormal results are displayed) Labs Reviewed  COMPREHENSIVE METABOLIC PANEL - Abnormal; Notable for the following:       Result Value   Potassium 3.0 (*)    Chloride 98 (*)    Glucose, Bld 147 (*)    All other components within normal limits  CBC WITH DIFFERENTIAL/PLATELET - Abnormal; Notable for the following:    WBC 16.8 (*)    Neutro Abs 13.2 (*)    Monocytes Absolute 1.3 (*)    All other components within normal limits  URINALYSIS, ROUTINE W REFLEX MICROSCOPIC (NOT AT [facility]) - Abnormal; Notable for the following:  Color, Urine AMBER (*)    APPearance CLOUDY (*)    Glucose, UA 100 (*)    Hgb urine dipstick LARGE (*)    Protein, ur 30 (*)    Nitrite POSITIVE (*)    All other components within normal limits  URINE MICROSCOPIC-ADD ON - Abnormal; Notable for the following:    Squamous Epithelial / LPF 6-30 (*)    Bacteria, UA FEW (*)    All other components within normal limits  I-STAT CG4 LACTIC ACID, ED - Abnormal; Notable for the following:    Lactic Acid, Venous 2.39 (*)    All other components within normal limits  CULTURE, BLOOD (ROUTINE X 2)  URINE CULTURE  CULTURE, BLOOD (ROUTINE X 2)  I-STAT BETA HCG BLOOD, ED ([facility], [facility], [facility] ONLY)  I-STAT CG4 LACTIC ACID, ED    EKG  EKG Interpretation None       Radiology No results found.  Procedures Procedures (including critical care time)  Medications Ordered in ED Medications  sodium chloride 0.9 % bolus 1,000 mL (not administered)    And  sodium chloride 0.9 % bolus 1,000 mL (not administered)    And  sodium chloride 0.9 % bolus 500 mL (not administered)  vancomycin (VANCOCIN) IVPB 1000 mg/200 mL premix (not administered)  acetaminophen (TYLENOL) tablet 1,000 mg (not administered)  HYDROmorphone (DILAUDID) injection 1 mg (not administered)  metoCLOPramide (REGLAN) injection 10 mg (not administered)    metroNIDAZOLE (FLAGYL) tablet 500 mg (not administered)  cefTRIAXone (ROCEPHIN) 2 g in dextrose 5 % 50 mL IVPB (not administered)  vancomycin (VANCOCIN) IVPB 1000 mg/200 mL premix (not administered)  acetaminophen (TYLENOL) tablet 650 mg (650 mg Oral Given 03/10/16 1017)     Initial Impression / Assessment and Plan / ED Course  I have reviewed the triage vital signs and the nursing notes.  Pertinent labs & imaging results that were available during my care of the patient were reviewed by me and considered in my medical decision making (see chart for details).  Clinical Course  Comment By Time  IV drug user and diabetic female with cellulitis of the left arm. She is febrile, tachycardic, with an elevated white blood cell count and elevated lactic acid. Patient is being treated for sepsis with 30 ml/kg saline, antibiotics. Blood cultures will be drawn. Have ordered Tylenol and pain medication. Arthor Captainbigail Liani Caris, PA-C 10/15 1244    Patient here, treated for cellulitis and sepsis. Patient had blood cultures drawn, she is stable throughout her visit without hypertension as her evidence of septic shock. I was unable to auscultate any murmur suggestive of endocarditis. Patient will be admitted to the internal medicine teaching service.  Final Clinical Impressions(s) / ED Diagnoses   Final diagnoses:  Sepsis, due to unspecified organism (HCC)  Cellulitis of left upper extremity  IVDU (intravenous drug user)  Hypokalemia    New Prescriptions New Prescriptions   No medications on file     Arthor Captainbigail Chelisa Hennen, PA-C 03/10/16 1445    Donnetta HutchingBrian Cook, MD 03/11/16 [phone]

## 2016-03-10 NOTE — ED Notes (Signed)
IV team at pt room, admitting team just went in and wants IV team to wait. IV team will return.

## 2016-03-10 NOTE — ED Notes (Signed)
Dr. Karma GreaserBoswell called, gave order to give Benadryl, then 15 minutes later give Toradol.

## 2016-03-10 NOTE — Progress Notes (Signed)
Patient signing out AMA.  Educated patient about reasons to stay and risks of leaving.  Patient still desiring to sign out AMA.  IV removed.  MD notified.  Peri MarisAndrew Adreonna Yontz, MBA, BSN, RN

## 2016-03-10 NOTE — ED Notes (Signed)
IV team at bedside 

## 2016-03-10 NOTE — Progress Notes (Signed)
Notified by RN that patient was leaving AMA. Went to see her. Discussed with her that I believe it would be beneficial for her to stay in the hospital to continue ABX therapy. She tells me that she is afraid of withdrawal and her arm is very painful. She is very tearful. States that she recently came off Methadone 150 mg daily in the past week because she was 'sick of it.' Would not directly admit to me that she had begun 'that crap' again due to withdrawal symptoms. However, there are no prescriptions filled for Methadone in the [place] Controlled Substance Database over the past year. She did agree to oral opiate therapy to help control her pain and withdrawal symptoms while she is here as well as prn medications for any symptoms that occurred. Will try to use as little opiates as possible to minimize her withdrawal symptoms. She has agreed to stay at this time.

## 2016-03-10 NOTE — Progress Notes (Addendum)
Pharmacy Antibiotic Note  Haley MooreCandace N Howell is a 30 y.o. female admitted on 03/10/2016 with left elbow and forearm cellulitis. PMH includes diabetes and IV drug use. Pharmacy has been consulted for vancomycin and Flagyl dosing. Spoke with Haley CaptainAbigail Howell about antibiotic coverage - ok to use cephalosporin with rash as PCN reaction. No labs currently however renal function was normal in 10/2015.  Plan: Vancomycin 1000 mg IV every 8 hours.  Goal trough 15-20 mcg/mL.  Ceftriaxone 2 g IV q24h Flagyl 500 mg PO q8h Monitor renal function, clinical progress, and culture data Trough as clinically indicated Narrow antibiotics as able  Height: 5\' 5"  (165.1 cm) Weight: 180 lb (81.6 kg) IBW/kg (Calculated) : 57  Temp (24hrs), Avg:101.2 F (38.4 C), Min:100.5 F (38.1 C), Max:101.8 F (38.8 C)  No results for input(s): WBC, CREATININE, LATICACIDVEN, VANCOTROUGH, VANCOPEAK, VANCORANDOM, GENTTROUGH, GENTPEAK, GENTRANDOM, TOBRATROUGH, TOBRAPEAK, TOBRARND, AMIKACINPEAK, AMIKACINTROU, AMIKACIN in the last 168 hours.  CrCl cannot be calculated (Patient's most recent lab result is older than the maximum 21 days allowed.).    Allergies  Allergen Reactions  . Ibuprofen     Other reaction(s): Cramps (ALLERGY/intolerance)  . Ketorolac Hives  . Nsaids     Stomach ulcer  . Penicillins     rash  . Tolmetin     Stomach ulcer  . Tramadol Hives  . Zofran [Ondansetron Hcl]     Causes headache    Antimicrobials this admission: Vancomycin 10/15 >>  Ceftriaxone 10/15 >>  Flagyl 10/15 >>  Dose adjustments this admission:   Microbiology results:   Thank you for allowing pharmacy to be a part of this patient's care.  Haley BackJennifer Howell, PharmD, BCPS Clinical Pharmacist Phone for today [phone] Main pharmacy - [phone] 03/10/2016 12:06 PM   Addendum:  SCr 0.85, CrCl >100 ml/min LA 2.39 WBC 16.8  Antibiotics as previously ordered  Haley BackJennifer Howell, PharmD, BCPS Clinical Pharmacist 03/10/2016 2:35  PM

## 2016-03-10 NOTE — H&P (Signed)
Date: 03/10/2016               Patient Name:  Haley Howell MRN: [id number]  DOB: [birth year] Age / Sex: 30 y.o., female   PCP: Rodolph Bong, MD         Medical Service: Internal Medicine Teaching Service         Attending Physician: Dr. Earl Lagos, MD    First Contact: Dr. Eulah Pont  Pager: [phone]  Second Contact: Dr. Valentino Nose Pager: [phone]       After Hours (After 5p/  First Contact Pager: [phone]  weekends / holidays): Second Contact Pager: [phone]   Chief Complaint: left arm swelling   History of Present Illness: Haley Howell is a 30 y.o. female with a PMH of IVDU, hypertention, and type 2 diabetes who presents with left arm swelling. Two days ago she noticed a small area of swelling in the fold of her arm that gradually increase over the past 2 days to the point of covering half of her arm. She also noticed pain and heat radiating from the area. She denies any trauma or bug bite to that area and states that she last used IV heroin months ago. She says when she does use IV drugs she does not share needles and  uses a new needle every time. She says she's never had skin infection like this in the past. She has been experiencing chills at home and yesterday her temperature was 102. She states that she has nausea constantly. She denies sick contacts, shortness of breath, chest pain, dysuria, or abdominal pain.  In the ED she was found to be febrile and tachycardic and had a lactic acid 2.39. Code sepsis was called. She was given 2.5 L normal saline bolus and blood cultures were drawn and she was started on vancomycin, ceftriaxone, and metronidazole.   Meds:  Prescriptions Prior to Admission  Medication Sig Dispense Refill Last Dose  . albuterol (PROVENTIL HFA;VENTOLIN HFA) 108 (90 BASE) MCG/ACT inhaler Inhale 1 puff into the lungs every 6 (six) hours as needed for wheezing or shortness of breath.   PRN  . aspirin 81 MG EC tablet Take 81 mg by mouth daily.     03/09/2016 at Unknown time  . cloNIDine (CATAPRES) 0.1 MG tablet Take 1 tablet (0.1 mg total) by mouth 3 (three) times daily. 90 tablet 2 03/10/2016 at 0600  . gabapentin (NEURONTIN) 600 MG tablet Take 1 tablet (600 mg total) by mouth 3 (three) times daily. 90 tablet 1 03/10/2016 at Unknown time  . lisdexamfetamine (VYVANSE) 50 MG capsule Take 50 mg by mouth daily.   03/10/2016 at Unknown time  . metFORMIN (GLUCOPHAGE) 500 MG tablet Take 1 tablet (500 mg total) by mouth daily with breakfast. 60 tablet 0 2-3 days  . promethazine (PHENERGAN) 25 MG tablet Take 1 tablet (25 mg total) by mouth every 8 (eight) hours as needed. for nausea 90 tablet 1 03/10/2016 at Unknown time  . zolpidem (AMBIEN) 10 MG tablet Take 10 mg by mouth at bedtime as needed for sleep.   PRN  . diazepam (VALIUM) 5 MG tablet Take 1 pill by mouth 3x daily for 3 days then Take 1 pill by mouth 2x daily for 3 days then Take 1 pill by moth daily for 3 days. (Patient not taking: Reported on 03/10/2016) 18 tablet 0 Not Taking at Unknown time  . furosemide (LASIX) 20 MG tablet Take 1 tablet (20 mg  total) by mouth 2 (two) times daily. (Patient not taking: Reported on 03/10/2016) 60 tablet 0 Not Taking at Unknown time  . glimepiride (AMARYL) 2 MG tablet Take 1 tablet (2 mg total) by mouth daily with breakfast. (Patient not taking: Reported on 03/10/2016) 30 tablet 0 Not Taking at Unknown time  . QUEtiapine (SEROQUEL) 300 MG tablet Take 1 tablet (300 mg total) by mouth at bedtime. (Patient not taking: Reported on 03/10/2016) 30 tablet 0 Not Taking at Unknown time    Allergies: Allergies as of 03/10/2016 - Review Complete 03/10/2016  Allergen Reaction Noted  . Ibuprofen  11/07/2015  . Ketorolac Hives 02/07/2015  . Nsaids  09/16/2014  . Penicillins  09/16/2014  . Reglan [metoclopramide] Nausea And Vomiting 03/10/2016  . Tolmetin  11/07/2015  . Tramadol Hives 02/07/2015  . Zofran [ondansetron hcl]  09/16/2014   Past Medical History:    Diagnosis Date  . ADHD (attention deficit hyperactivity disorder)   . Anxiety   . Diabetes mellitus without complication (HCC)   . H/O degenerative disc disease   . Hypertension   . Kidney stone   . Ovarian cyst   . Panic attack   . Stomach ulcer     Family History:  Mother- DM, HTN, depression  Dad- DM, HTN, HLD   Social History:  [place] in [place], she is not currently working. She has an 8 pack year smoking history, has smoked one half pack per day for the last 15 years. She denies alcohol use. She states that she has used IV heroin in the past but the last time that she used was months ago.  Review of Systems: A complete ROS was negative except as per HPI.   Physical Exam: Vitals:   03/10/16 1014 03/10/16 1115 03/10/16 1358 03/10/16 1430  BP:   134/82 131/79  Pulse:   102 100  Resp:   (!) 32 14  Temp: 101.8 F (38.8 C) 100.5 F (38.1 C)    TempSrc: Oral Oral    SpO2:   94% 96%  Weight:      Height:       Physical Exam  Constitutional: She is oriented to person, place, and time. She appears well-developed and well-nourished. No distress.  HENT:  Head: Normocephalic and atraumatic.  Eyes: EOM are normal. Pupils are equal, round, and reactive to light.  Cardiovascular: Normal rate and regular rhythm.   No murmur heard. Pulmonary/Chest: She has no wheezes. She has no rales.  Abdominal: Soft. She exhibits no distension. There is no tenderness.  Musculoskeletal: She exhibits edema and tenderness.  Decreased ROM left forearm  Neurological: She is alert and oriented to person, place, and time.  Skin: Skin is warm and dry.  left forearm area of erythema and swelling extending halfway down forearm and halfway up upper arm  Psychiatric: Her behavior is normal. She is inattentive.   Labs: CBC:  Recent Labs Lab 03/10/16 1212  WBC 16.8*  NEUTROABS 13.2*  HGB 14.1  HCT 40.8  MCV 86.8  PLT 159    Basic Metabolic Panel:  Recent Labs Lab 03/10/16 1212   NA 135  K 3.0*  CL 98*  CO2 25  GLUCOSE 147*  BUN 6  CREATININE 0.85  CALCIUM 9.6   Liver Function Tests:  Recent Labs Lab 03/10/16 1212  AST 20  ALT 23  ALKPHOS 91  BILITOT 0.8  PROT 7.5  ALBUMIN 3.7   CBG: Lab Results  Component Value Date   HGBA1C 10.6  11/07/2015   Urine Studies: Urinalysis    Component Value Date/Time   COLORURINE AMBER (A) 03/10/2016 1334   APPEARANCEUR CLOUDY (A) 03/10/2016 1334   LABSPEC 1.014 03/10/2016 1334   PHURINE 6.0 03/10/2016 1334   GLUCOSEU 100 (A) 03/10/2016 1334   HGBUR LARGE (A) 03/10/2016 1334   BILIRUBINUR NEGATIVE 03/10/2016 1334   KETONESUR NEGATIVE 03/10/2016 1334   PROTEINUR 30 (A) 03/10/2016 1334   UROBILINOGEN 0.2 02/07/2015 0835   NITRITE POSITIVE (A) 03/10/2016 1334   LEUKOCYTESUR NEGATIVE 03/10/2016 1334   EKG: EKG: Sinus tachycardia  Assessment & Plan by Problem:  Active Problems:   Bipolar I disorder (HCC)   Essential (primary) hypertension   Gastro-esophageal reflux disease without esophagitis   Type 2 diabetes mellitus, uncontrolled (HCC)   Sepsis (HCC)   Hypokalemia   Nicotine dependence  Cellulitis  Ms. Janaisha N Krisko is a 30 y.o. female with PMH IVDU, hypertention, and type 2 diabetes who presented today with left arm swelling and erythema. She denies any trauma to this area and states that the last time she used IV drugs was over a month ago however she told the emergency department physician that she used IV heroin yesterday and had a urine toxicology which was positive for opiates. In the setting of her history of intravenous drug use we presume that this presentation may be related to cellulitis. She was initially started on the IV vancomycin, IV ceftriaxone, and flagyl. After discussion with pharmacy we will discontinue the vancomycin and flagyl and continue the ceftriaxone gram positive MSSA and strep coverage and start doxycycline for MRSA and anaerobic coverage.  -Tylenol for pain -Follow up  blood cultures  -Follow-up urine culture -Follow up HIV screening  -follow up Hepatitis C antibody screening  -follow up Echocardiogram   Type 2 DM  HgbA1C 10/2015 10.6. Her outpatient medications include Metformin and gabapentin for neuropathy. We have continued her home medication gabapentin 600 mg 3 times daily - We will monitor with CBG and sensitive ISS   Hypertension She is currently normotensive. We have continued her home medication clonidine 0.1 mg TID.   Nicotine dependence  Current 1/2 pack per day smoker.  -nicotine patch 7 mg   Hypokalemia  On admission she had a potassium 3.0 that was asymptomatic. We repleated with K-Dur 40 meq.  -follow up repeat BMET   Opiate dependence  Patient threatened to leave AMA because she was worried about going through methadone withdrawal. We have ordered oxycodone IR 10 mg, loperamide for diarrhea, promethazine for nausea, baclofen for muscle spasm to combat her withdrawal symptoms.   Anxiety  She reports that she takes clonazepam (klonipine) 0.5 mg daily however urine drug screen was negative for benzodiazepines. Given the long half-life of clonazepam we would expect that it should show up on the UDS given that this is prescribed with 3 times per day dosing on the database. We will hold this medication for now.  F 2.5 L NS bolus in the ED  E Hypokalemia  N carb modified diet  DVT Ppx lovenox  Code Status FULL   Dispo: Admit patient to Inpatient with expected length of stay greater than 2 midnights.  Signed: Eulah Pont, MD 03/10/2016, 5:06 PM  Pager: [phone]

## 2016-03-10 NOTE — ED Triage Notes (Signed)
Pt presents to ED with redness and swelling to left elbow and forearm., pt states has been there for 3 days. Pt has +3 radial pulse.

## 2016-03-11 DIAGNOSIS — L03114 Cellulitis of left upper limb: Secondary | ICD-10-CM

## 2016-03-11 DIAGNOSIS — B9689 Other specified bacterial agents as the cause of diseases classified elsewhere: Secondary | ICD-10-CM

## 2016-03-11 LAB — CBC
HCT: 37.5 % (ref 36.0–46.0)
HEMOGLOBIN: 12.6 g/dL (ref 12.0–15.0)
MCH: 29.3 pg (ref 26.0–34.0)
MCHC: 33.6 g/dL (ref 30.0–36.0)
MCV: 87.2 fL (ref 78.0–100.0)
Platelets: 181 10*3/uL (ref 150–400)
RBC: 4.3 MIL/uL (ref 3.87–5.11)
RDW: 13.8 % (ref 11.5–15.5)
WBC: 18.5 10*3/uL — ABNORMAL HIGH (ref 4.0–10.5)

## 2016-03-11 LAB — BASIC METABOLIC PANEL
ANION GAP: 12 (ref 5–15)
BUN: 6 mg/dL (ref 6–20)
CALCIUM: 9.3 mg/dL (ref 8.9–10.3)
CO2: 25 mmol/L (ref 22–32)
Chloride: 102 mmol/L (ref 101–111)
Creatinine, Ser: 0.78 mg/dL (ref 0.44–1.00)
GFR calc Af Amer: >60 mL/min (ref >60)
GFR calc non Af Amer: >60 mL/min (ref >60)
GLUCOSE: 120 mg/dL — AB (ref 65–99)
Potassium: 3.2 mmol/L — ABNORMAL LOW (ref 3.5–5.1)
Sodium: 139 mmol/L (ref 135–145)

## 2016-03-11 LAB — URINE CULTURE

## 2016-03-11 LAB — GLUCOSE, CAPILLARY
GLUCOSE-CAPILLARY: 151 mg/dL — AB (ref 65–99)
Glucose-Capillary: 133 mg/dL — ABNORMAL HIGH (ref 65–99)

## 2016-03-11 LAB — HIV ANTIBODY (ROUTINE TESTING W REFLEX): HIV Screen 4th Generation wRfx: NONREACTIVE

## 2016-03-11 MED ORDER — CEPHALEXIN 500 MG PO CAPS: 500.0000 mg | ORAL_CAPSULE | Freq: Two times a day (BID) | ORAL | Status: DC

## 2016-03-11 MED ORDER — CEPHALEXIN 500 MG PO CAPS: 500.0000 mg | ORAL_CAPSULE | Freq: Two times a day (BID) | ORAL | 0 refills | Status: AC

## 2016-03-11 MED ORDER — DOXYCYCLINE HYCLATE 100 MG PO TABS: 100.0000 mg | ORAL_TABLET | Freq: Two times a day (BID) | ORAL | 0 refills | Status: AC

## 2016-03-11 MED ORDER — DOXYCYCLINE HYCLATE 100 MG PO TABS: 100.0000 mg | ORAL_TABLET | Freq: Two times a day (BID) | ORAL | Status: DC

## 2016-03-11 NOTE — Progress Notes (Signed)
Patient discharging AMA. IV removed and Tele taken off. Will notify MD

## 2016-03-11 NOTE — Progress Notes (Signed)
  Date: 03/11/2016  Patient name: Haley Howell  Medical record number: [id number]  Date of birth: 03-17-1986   I have seen and evaluated Haley Howell and discussed their care with the Residency Team. In brief, patient is a 30 y/o female with PMH of IVDA, HTM, DM who p/w left arm swelling * 2 days. Patient states that she noted swelling beginning in her left antecubital fossa which progressively worsened and spread above and below her left elbow and was associated with pain, chills and fevers up to 102 F. She does have a history of IVDA and told the resident she uses a new needle every time.  She complained of pain in her left arm today with persistent swelling.  PMHx, Fam Hx, and/or Soc Hx : as per resident admit note  Vitals:   03/11/16 0459 03/11/16 0958  BP: 113/71 (!) 101/57  Pulse: 92 81  Resp: 18 18  Temp:  98.3 F (36.8 C)   Gen: AAO*3, NAD CVS: RRR, normal heart sounds Lungs: CTA b/l Abd: soft, non tender, BS + Ext: R UE swelling and erythema extending from her upper arm to her mid forearm with some mild fluctuance in the antecubital fossa with some mild increased local warmth  Assessment and Plan: I have seen and evaluated the patient as outlined above. I agree with the formulated Assessment and Plan as detailed in the residents' note, with the following changes:   1. L UE cellulitis: - Patient with L UE swelling/erythema, fevers, leukocytosis consistent with cellulitis. She has a h/o recent IVDA and I suspect that is the etiology for her cellulitis. Patient with no discrete abscess noted on exam. - Abx changed to cefazolin and doxy today.  - Will f/u blood cx - c/w pain control - Would consider further imaging with MRI to rule out abscess if no improvement - Patient signed out AMA today and was dc'd on PO keflex and doxy. [facility]  Earl LagosNischal Rollen Selders, MD 10/16/20175:33 PM

## 2016-03-11 NOTE — Progress Notes (Signed)
Spoke with patient regarding medications and educated her as to why she was not receiving her vyvanse and klonapin. Patient became increasingly frustrated and said she is wanting to go to [facility]. I asked the patient if she wanted me to inform her MD regarding transfering her to [facility]. she stated " I am going to transfer myself there.  I informed patient she will need to sign a leaving AMA form if she does leave. Will inform MD.

## 2016-03-11 NOTE — Discharge Summary (Signed)
Patient left against medical advice   Name: Haley Howell MRN: [id number] DOB: [birth year] 30 y.o. PCP: Rodolph Bong, MD  Date of Admission: 03/10/2016 11:05 AM Date of Discharge: 03/11/2016 Attending Physician: No att. providers found  Discharge Diagnosis: Principal Problem:   Cellulitis Active Problems:   Bipolar I disorder (HCC)   Essential (primary) hypertension   Gastro-esophageal reflux disease without esophagitis   Type 2 diabetes mellitus, uncontrolled (HCC)   Hypokalemia   Nicotine dependence   IV drug abuse   Discharge Medications:   Medication List    TAKE these medications   cephALEXin 500 MG capsule Commonly known as:  KEFLEX Take 1 capsule (500 mg total) by mouth every 12 (twelve) hours.   doxycycline 100 MG tablet Commonly known as:  VIBRA-TABS Take 1 tablet (100 mg total) by mouth 2 (two) times daily.     ASK your doctor about these medications   albuterol 108 (90 Base) MCG/ACT inhaler Commonly known as:  PROVENTIL HFA;VENTOLIN HFA Inhale 1 puff into the lungs every 6 (six) hours as needed for wheezing or shortness of breath.   aspirin 81 MG EC tablet Take 81 mg by mouth daily.   cloNIDine 0.1 MG tablet Commonly known as:  CATAPRES Take 1 tablet (0.1 mg total) by mouth 3 (three) times daily.   diazepam 5 MG tablet Commonly known as:  VALIUM Take 1 pill by mouth 3x daily for 3 days then Take 1 pill by mouth 2x daily for 3 days then Take 1 pill by moth daily for 3 days.   furosemide 20 MG tablet Commonly known as:  LASIX Take 1 tablet (20 mg total) by mouth 2 (two) times daily.   gabapentin 600 MG tablet Commonly known as:  NEURONTIN Take 1 tablet (600 mg total) by mouth 3 (three) times daily.   glimepiride 2 MG tablet Commonly known as:  AMARYL Take 1 tablet (2 mg total) by mouth daily with breakfast.   lisdexamfetamine 50 MG capsule Commonly known as:  VYVANSE Take 50 mg by mouth daily.   metFORMIN 500 MG tablet Commonly known  as:  GLUCOPHAGE Take 1 tablet (500 mg total) by mouth daily with breakfast.   promethazine 25 MG tablet Commonly known as:  PHENERGAN Take 1 tablet (25 mg total) by mouth every 8 (eight) hours as needed. for nausea   QUEtiapine 300 MG tablet Commonly known as:  SEROQUEL Take 1 tablet (300 mg total) by mouth at bedtime.   zolpidem 10 MG tablet Commonly known as:  AMBIEN Take 10 mg by mouth at bedtime as needed for sleep.      Disposition and follow-up:   Haley Howell left AMA from [facility] in [place] condition.  At the hospital follow up visit please address:  1.  Cellulitis- did she fill Doxycycline and keflex prescriptions? Has her left arm improved from this photo?      2.  Labs / imaging needed at time of follow-up: CBC, BMET   3.  Pending labs/ test needing follow-up: none   Follow-up Appointments:   Hospital Course by problem list: Principal Problem:   Cellulitis Active Problems:   Bipolar I disorder (HCC)   Essential (primary) hypertension   Gastro-esophageal reflux disease without esophagitis   Type 2 diabetes mellitus, uncontrolled (HCC)   Hypokalemia   Nicotine dependence   IV drug abuse   1. Cellulitis  Haley Howell has a past history of IVDA and Type 2 DM who presented  with left arm swelling which had started in her antecubital fossa and progressed over the past few days. In the ED she was found to be febrile (temp 101.78) and tachycardic and had a lactic acid 2.39. Blood cultures were drawn and she was started on vancomycin, ceftriaxone, and metronidazole for cellulitis in the setting of penicillin allergy. She received 2 days of antibiotics but then decided to leave and go to [facility] to continue her care. On the day that she left she had Tmax 101.6 and continued to have left arm swelling. She was advised that this was a bad infection and she should stay for treatment but she refused to be transferred and refused to stay at [facility]  for treatment. Prior to leaving AMA she took printed prescriptions for doxycycline and keflex for a 10 day total course. Blood cultures had no growth for 5 days. We were not able to perform imaging but she should have follow up with MRI imaging to evaluate for an abscess.   Discharge Vitals:   BP (!) 101/57 (BP Location: Right Arm)   Pulse 81   Temp 98.3 F (36.8 C) (Oral)   Resp 18   Ht 5\' 5"  (1.651 m)   Wt 180 lb (81.6 kg)   SpO2 97%   BMI 29.95 kg/m     Discharge Instructions:   Signed: Eulah PontNina Mersedes Alber, MD 03/11/2016, 6:44 PM   Pager: [phone]

## 2016-03-11 NOTE — Progress Notes (Signed)
   Subjective: Haley Howell continues to feel pain and swelling in her left arm. She denies chills and denies any new concerns or complaints. After rounds she wanted to leave AMA, she stated that her friend who was her ride had an appointment to get to in [place] and she wanted to go to [facility] to be treated closer to her family. We discussed the importance of continuing IV antibiotics but she became upset and said " I'm not from around here, I'm from [place], I already decided I'm going home."   Objective:  Vital signs in last 24 hours: Vitals:   03/10/16 2026 03/11/16 0315 03/11/16 0459 03/11/16 0958  BP: 115/66  113/71 (!) 101/57  Pulse: 98  92 81  Resp: 20  18 18   Temp: (!) 101.2 F (38.4 C) (!) 101.6 F (38.7 C)  98.3 F (36.8 C)  TempSrc: Oral Oral Oral Oral  SpO2: 94%  99% 97%  Weight:      Height:       Physical Exam  Constitutional: She appears well-developed and well-nourished. No distress.  Cardiovascular: Normal rate and regular rhythm.   No murmur heard. Pulmonary/Chest: She has no wheezes. She has no rales.  Abdominal: Soft. There is no tenderness.  Skin:  The erythema over the left arm has extended 1/2 an inch above the area marked yesterday   Extremities: no calf tenderness, no peripheral edema   Assessment/Plan:  Principal Problem:   Cellulitis Active Problems:   Bipolar I disorder (HCC)   Essential (primary) hypertension   Gastro-esophageal reflux disease without esophagitis   Type 2 diabetes mellitus, uncontrolled (HCC)   Hypokalemia   Nicotine dependence   IV drug abuse  Cellulitis   She was febrile overnight with tmax 101.6 and remains tachycardic. Her leukocytosis has worsened and the area of erythema which was marked has increased slightly. There is no clear area of abscess. Blood cultures show no growth to date.The plan was to change ceftriaxone to cefazolin and continue doxy for MRSA coverage today before she left. She was provided with a  prescription for doxy and keflex when she left and she stated that she does not have a primary care doctor but she would be going straight to [facility].   Dispo: She left AMA today  LOS: 1 day   Haley PontNina Eastyn Skalla, MD 03/11/2016, 6:37 PM Pager: [phone]

## 2016-03-12 LAB — HEPATITIS C ANTIBODY (REFLEX)

## 2016-03-12 LAB — HCV COMMENT:

## 2016-03-15 LAB — CULTURE, BLOOD (ROUTINE X 2)
CULTURE: NO GROWTH
Culture: NO GROWTH

## 2017-01-02 IMAGING — CT CT RENAL STONE PROTOCOL
2 of 4 series · 16 of 46 positions shown, 18 images · non-contrast
Comparison: CT scan of September 24, 2014.

CLINICAL DATA: Left flank pain.

EXAM:
CT ABDOMEN AND PELVIS WITHOUT CONTRAST
TECHNIQUE: Multidetector CT imaging of the abdomen and pelvis was performed
following the standard protocol without IV contrast.

[Series 2: stone study 5.0 i30f 1 · axial · 0.89mm/px · z∈[-847,-357]mm · 13 of 108 slices shown, 15 images]
[im 5/108  soft-tissue]
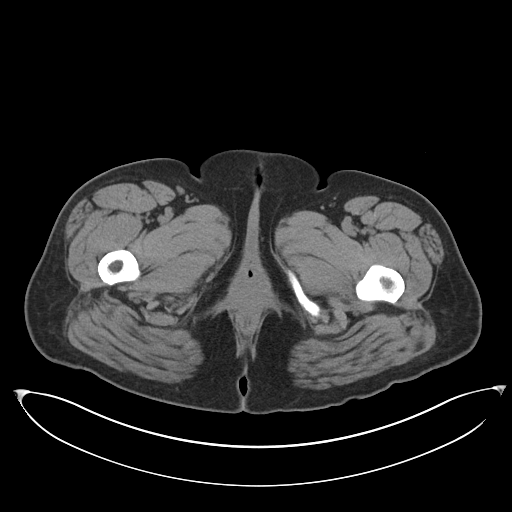
[im 5/108  bone]
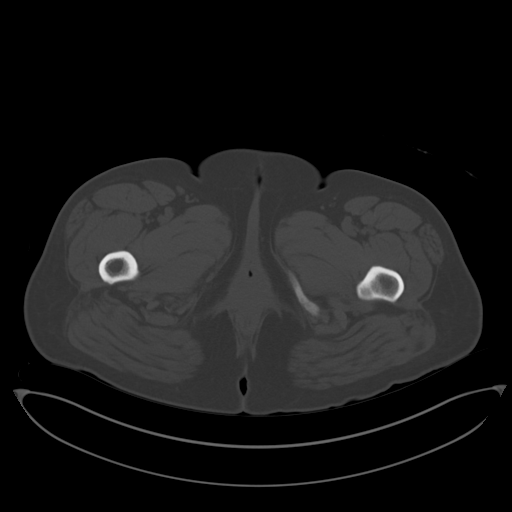
[im 13/108  soft-tissue]
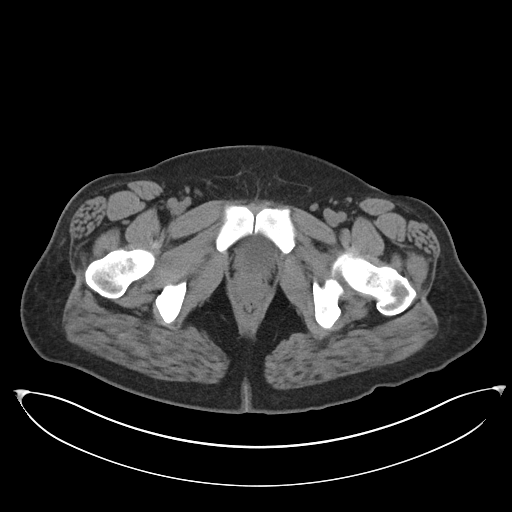
[im 21/108  soft-tissue]
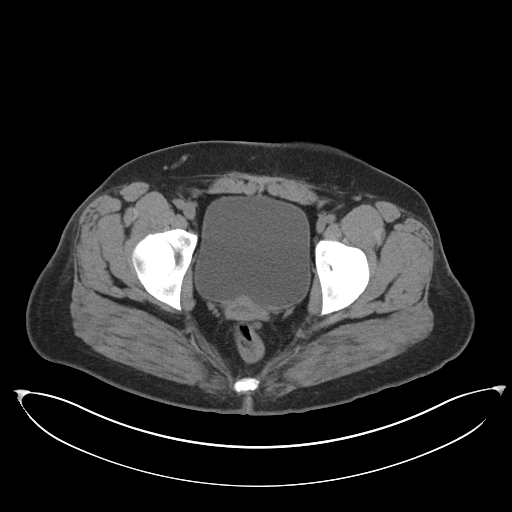
[im 29/108  soft-tissue]
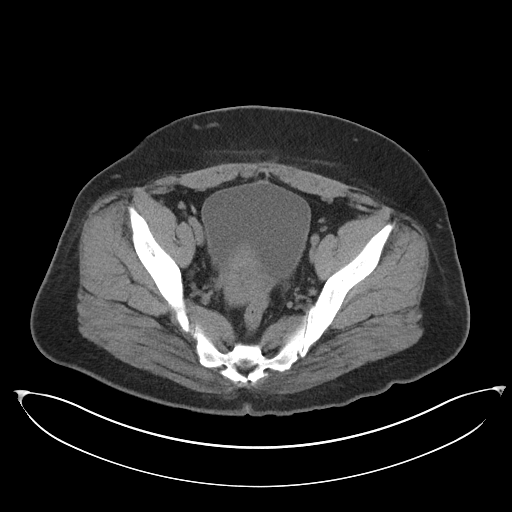
[im 38/108  soft-tissue]
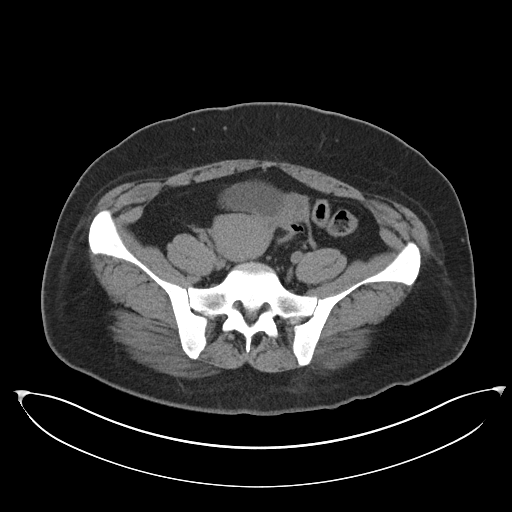
[im 46/108  soft-tissue]
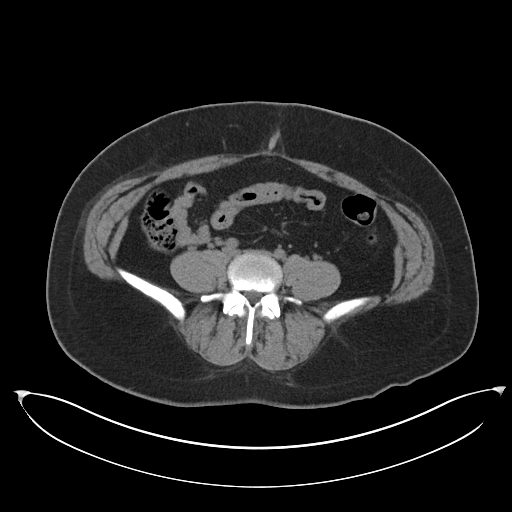
[im 54/108  soft-tissue]
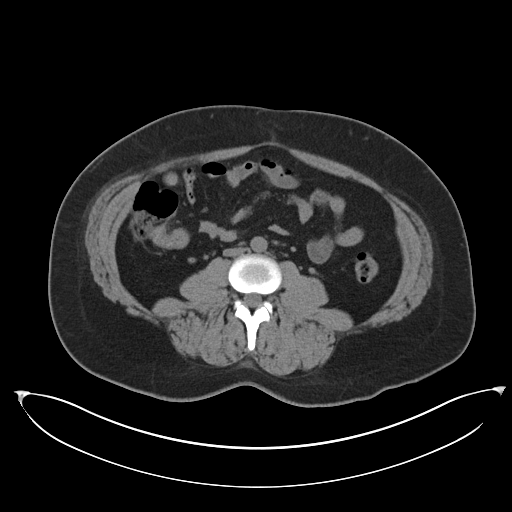
[im 62/108  soft-tissue]
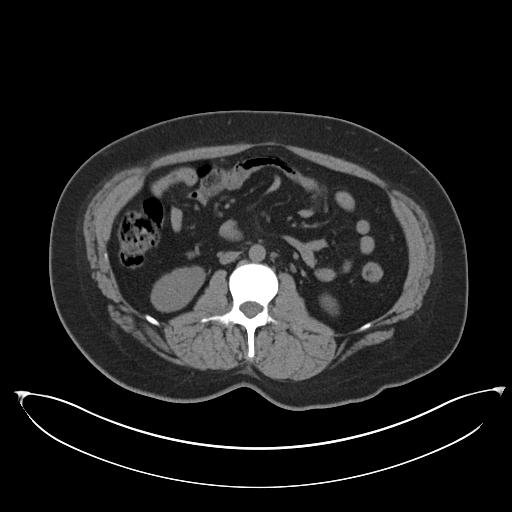
[im 70/108  soft-tissue]
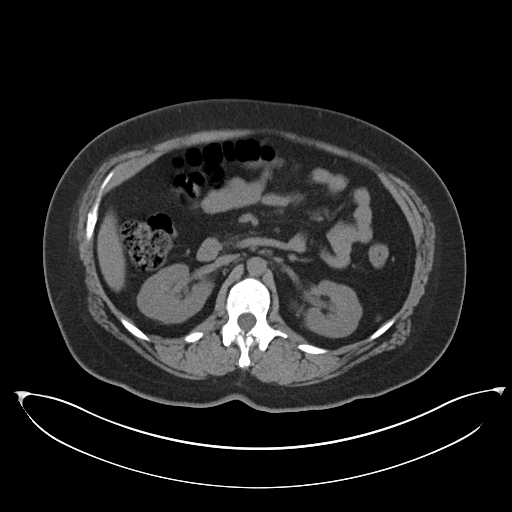
[im 70/108  bone]
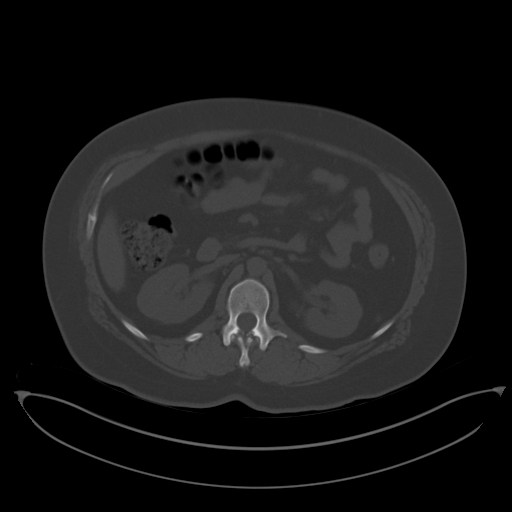
[im 79/108  soft-tissue]
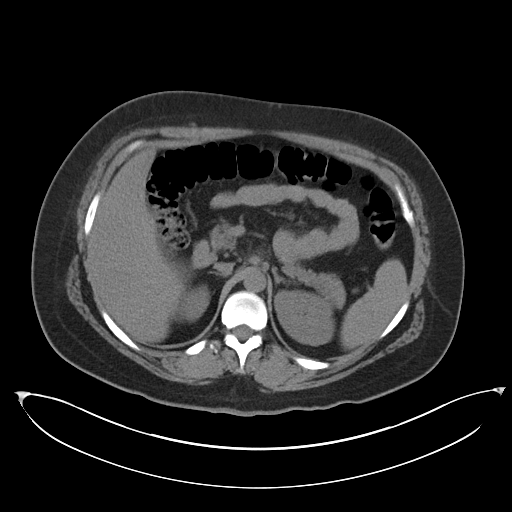
[im 87/108  soft-tissue]
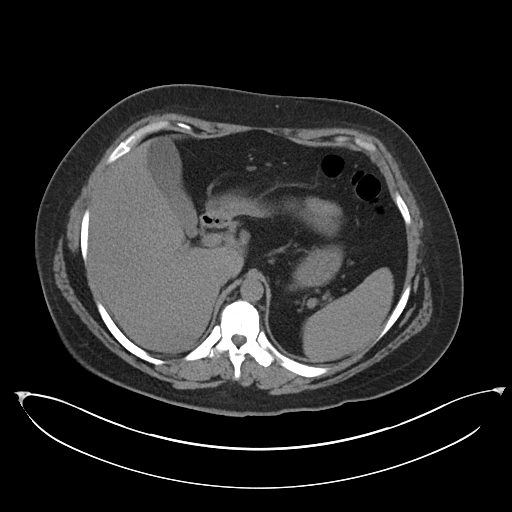
[im 95/108  soft-tissue]
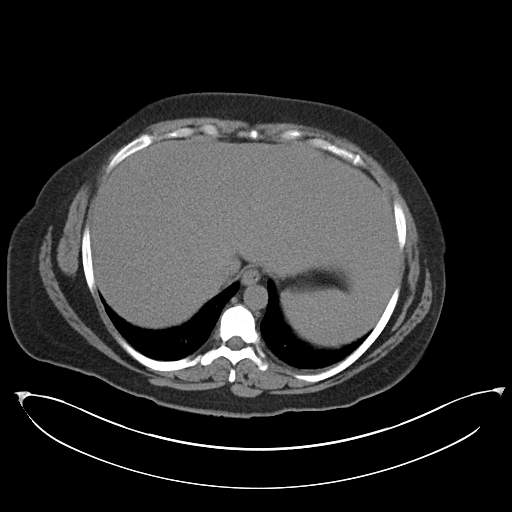
[im 103/108  soft-tissue]
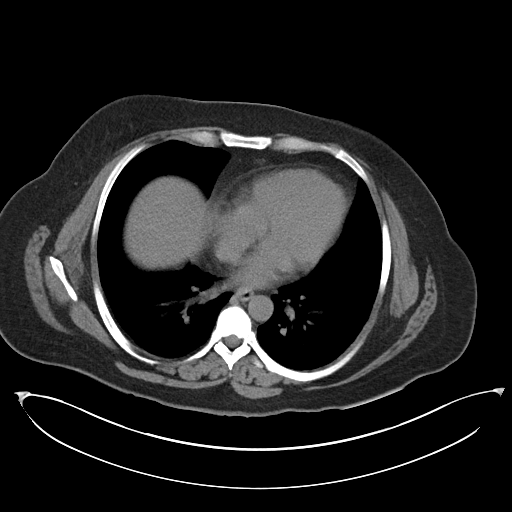

[Series 5: coronal soft tissue · coronal · 0.84mm/px · 3 of 96 slices shown]
[im 32/96  soft-tissue]
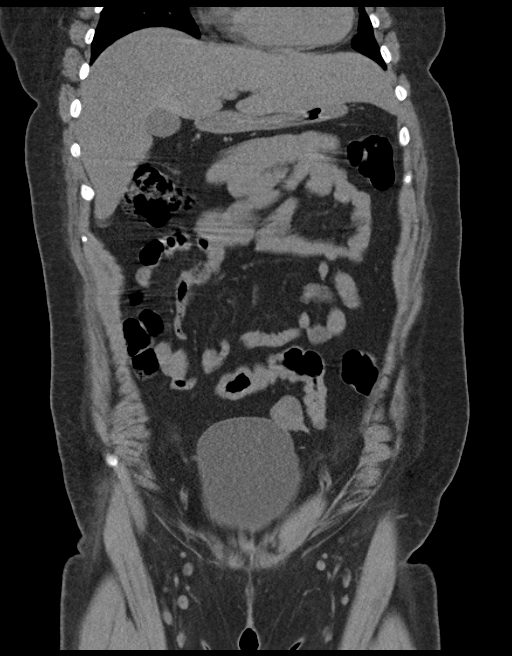
[im 43/96  soft-tissue]
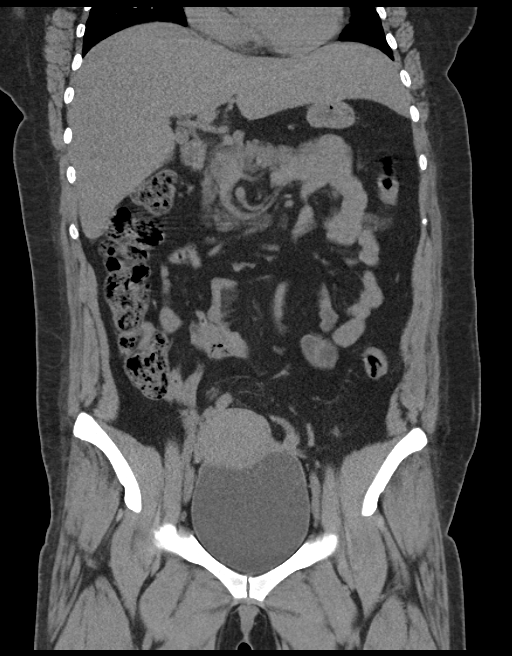
[im 53/96  soft-tissue]
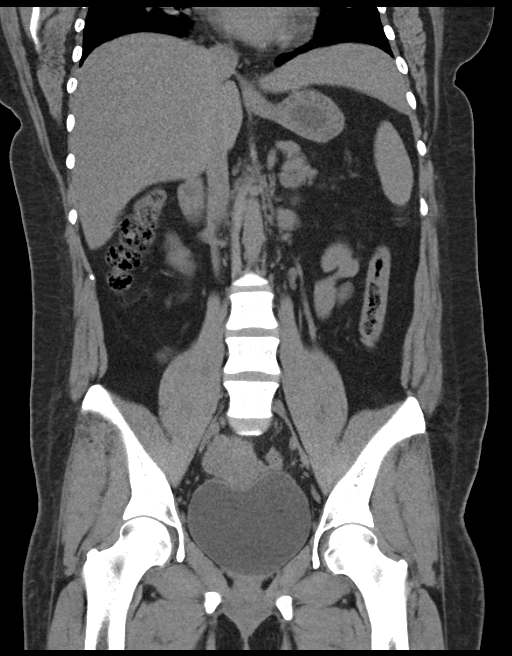

[16 of 46 positions shown; findings below may reference images not displayed]

FINDINGS: Visualized lung bases appear normal. No significant osseous
abnormality is noted.

No gallstones are noted. Fatty infiltration of the liver is noted
with sparing around the gallbladder fossa. Mild hepatomegaly is
noted. The spleen and pancreas appear normal. Adrenal glands and
kidneys appear normal. No hydronephrosis or renal obstruction is
noted. No renal or ureteral calculi are noted. The appendix appears
normal. There is no evidence of bowel obstruction. No abnormal fluid
collection is noted. Uterus and ovaries appear normal. Urinary
bladder appears normal. No significant adenopathy is noted.
IMPRESSION: Mild hepatomegaly is noted with diffuse fatty infiltration. No
hydronephrosis or renal obstruction is noted. No renal or ureteral
calculi are noted.

## 2017-05-08 IMAGING — US US EXTREM LOW VENOUS*R*
1 series · 13 of 24 positions shown · non-contrast
Comparison: None.

CLINICAL DATA: Right leg swelling. Fall earlier today. Question
DVT.



[Series 1: us extrem low venous*right* · 0.09mm/px · 27 acquisitions, 13 frames shown]
[im 1/27]
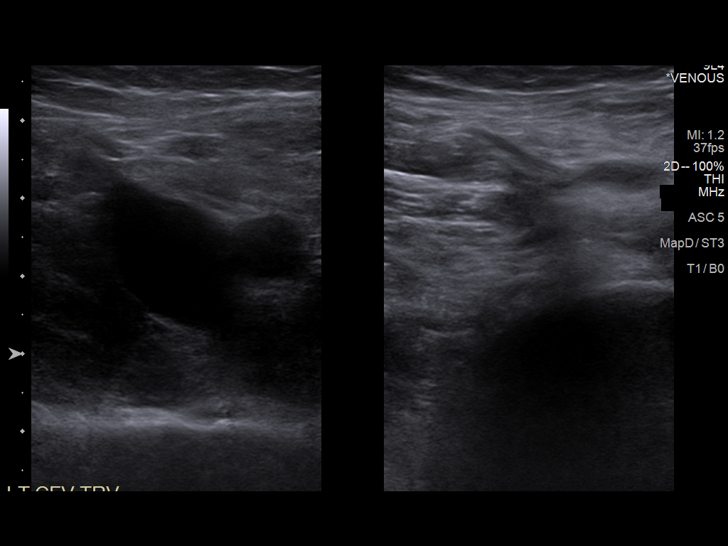
[im 3/27]
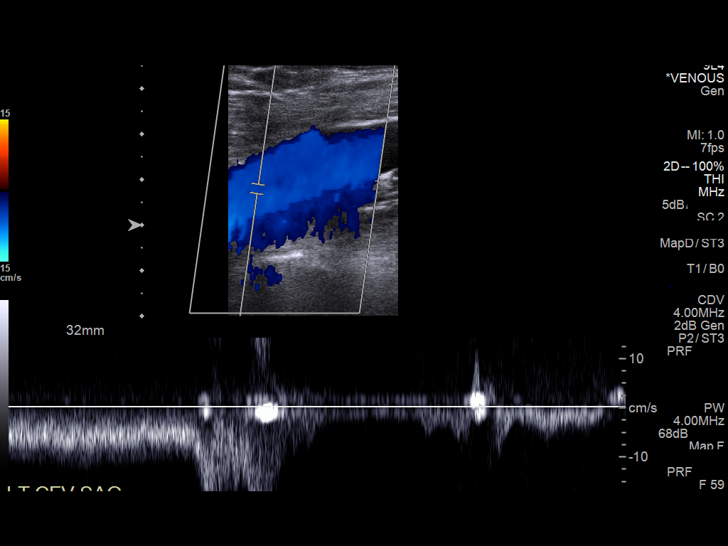
[im 5/27]
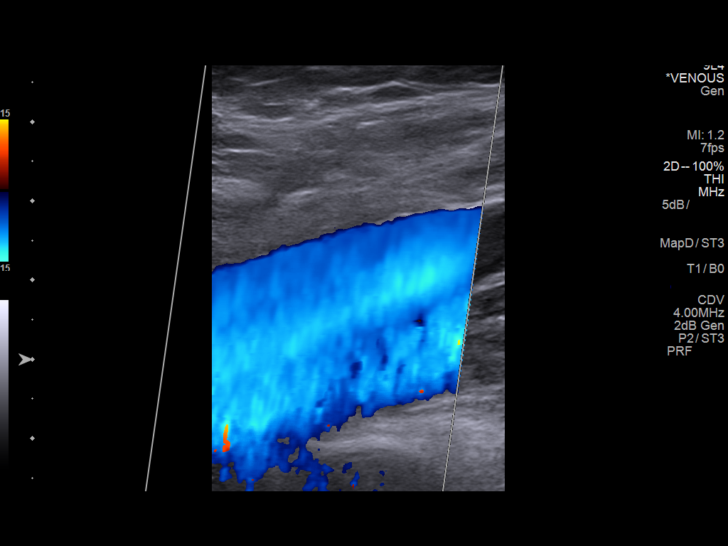
[im 7/27]
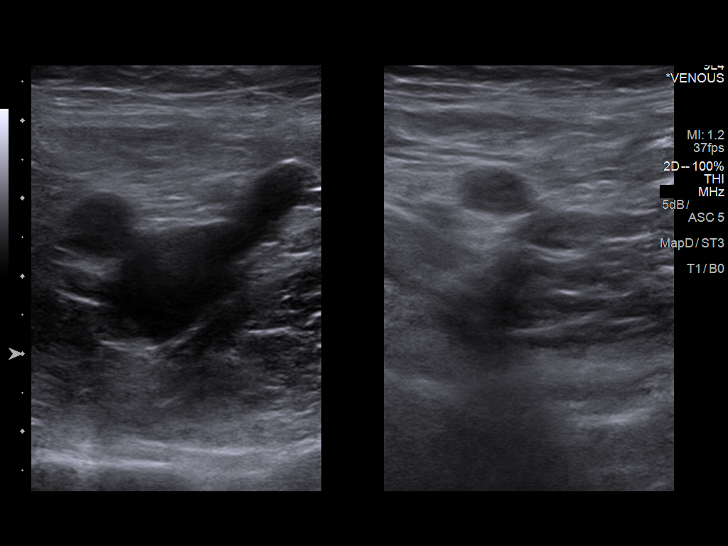
[im 10/27]
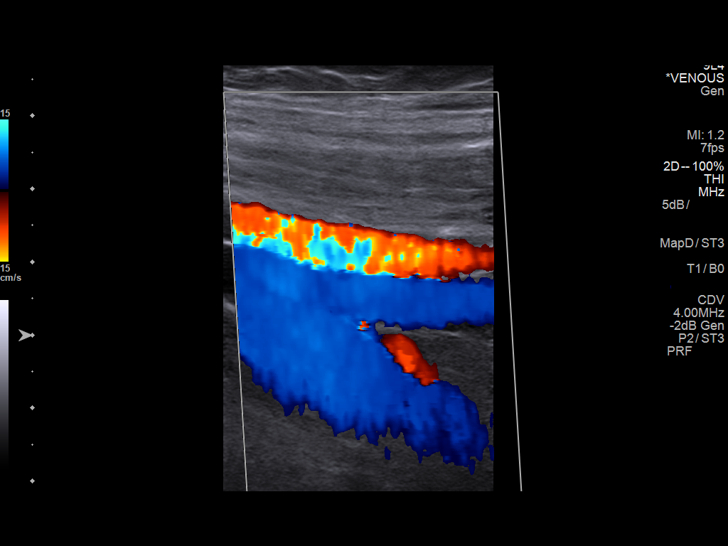
[im 12/27]
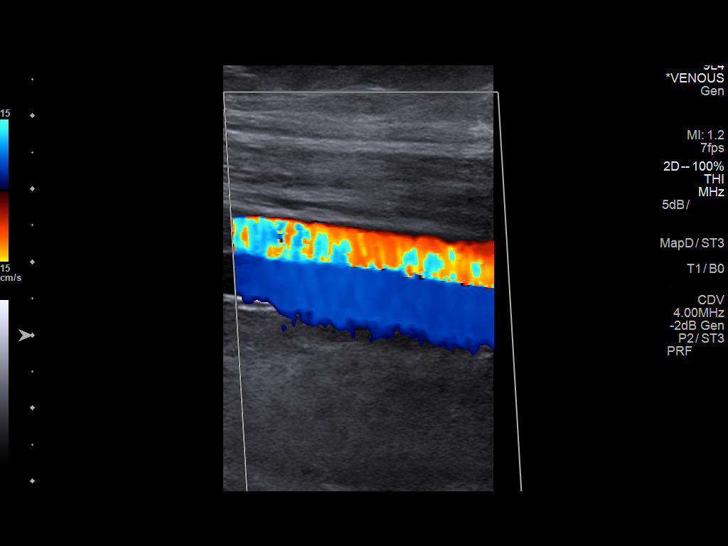
[im 15/27]
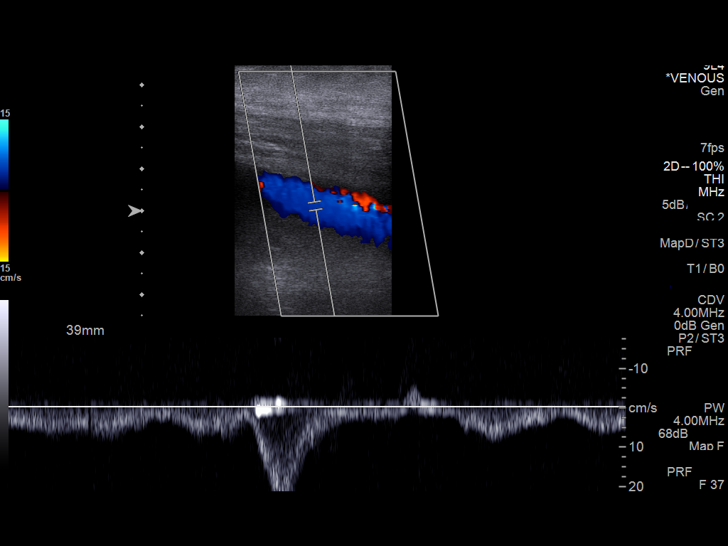
[im 16/27]
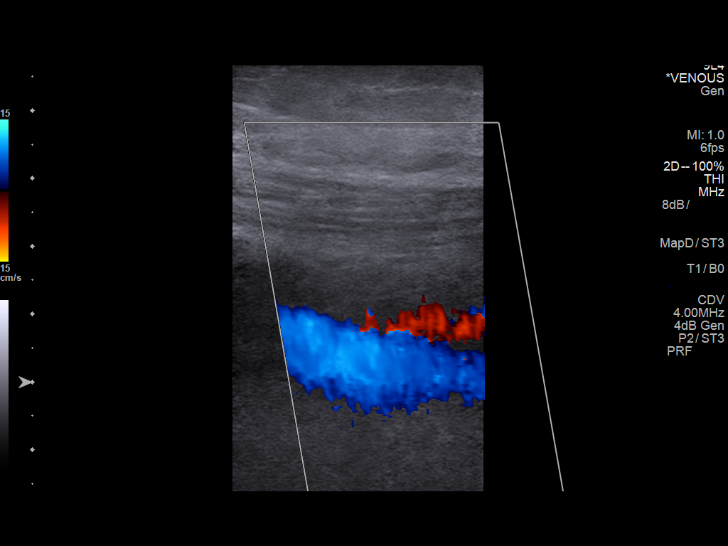
[im 19/27]
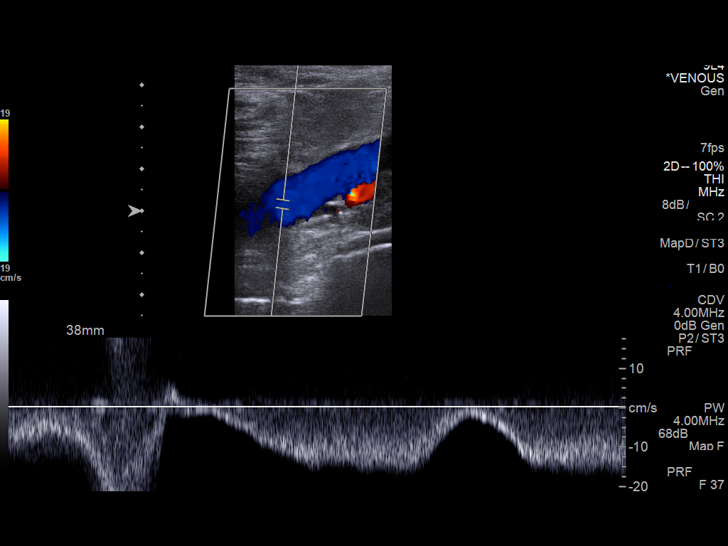
[im 21/27]
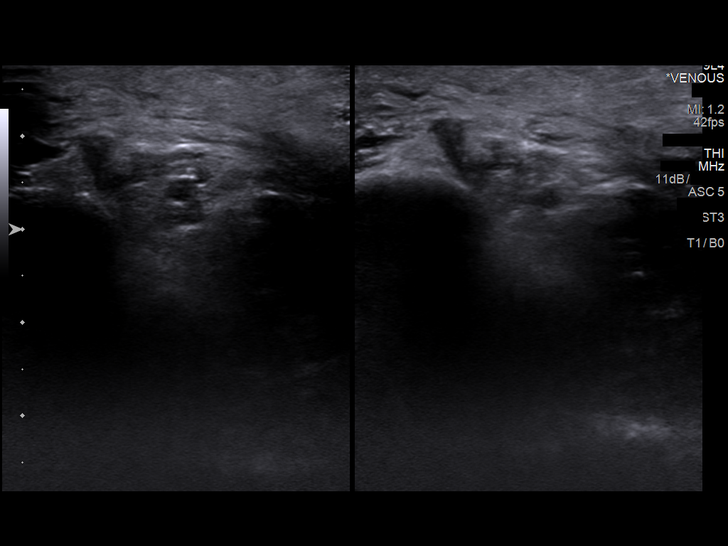
[im 23/27]
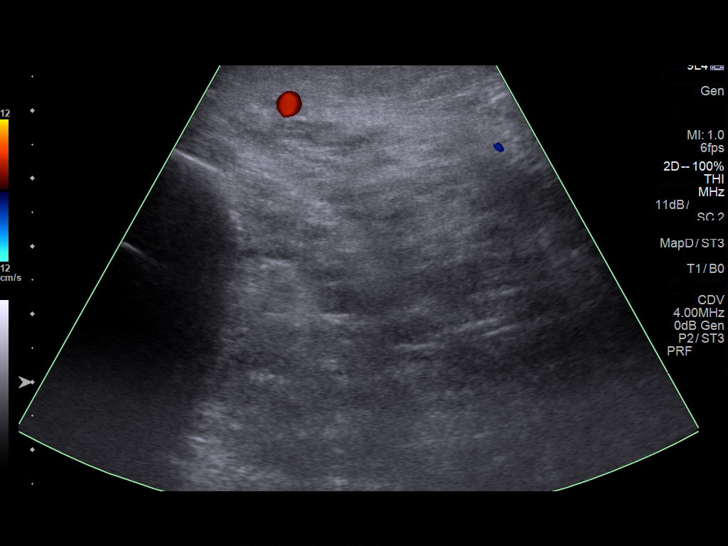
[im 24/27]
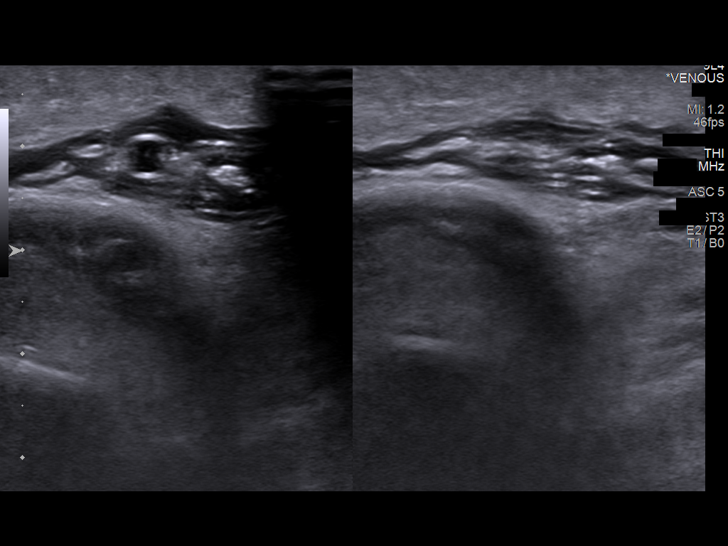
[im 27/27]
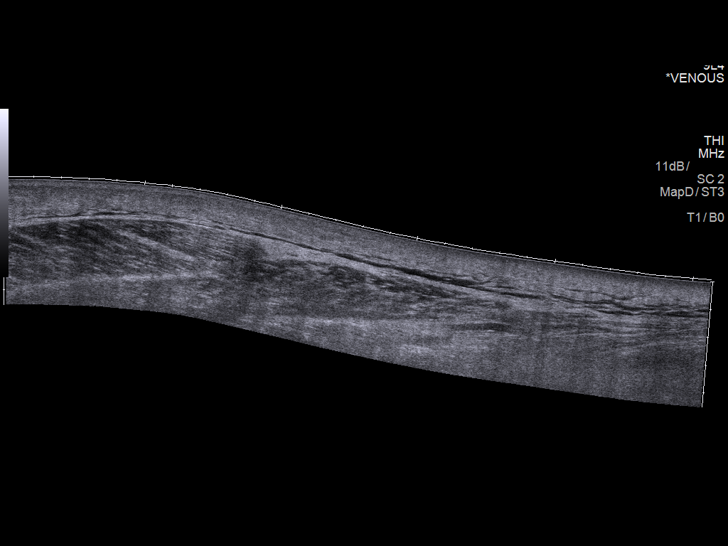

[13 of 24 positions shown; findings below may reference images not displayed]

FINDINGS: Contralateral Common Femoral Vein: Respiratory phasicity is normal
and symmetric with the symptomatic side. No evidence of thrombus.
Normal compressibility.

Common Femoral Vein: No evidence of thrombus. Normal
compressibility, respiratory phasicity and response to augmentation.

Saphenofemoral Junction: No evidence of thrombus. Normal
compressibility and flow on color Doppler imaging.

Profunda Femoral Vein: No evidence of thrombus. Normal
compressibility and flow on color Doppler imaging.

Femoral Vein: No evidence of thrombus. Normal compressibility,
respiratory phasicity and response to augmentation.

Popliteal Vein: No evidence of thrombus. Normal compressibility,
respiratory phasicity and response to augmentation.

Calf Veins: No evidence of thrombus. Normal compressibility and flow
on color Doppler imaging.

Superficial Great Saphenous Vein: No evidence of thrombus. Normal
compressibility and flow on color Doppler imaging.

Venous Reflux:  None.

Other Findings:  Soft tissue edema in the calf.
IMPRESSION: No evidence of right lower extremity deep venous thrombosis.

## 2020-05-27 DEATH — deceased
